# Patient Record
Sex: Female | Born: 1960 | Race: White | Hispanic: No | Marital: Married
Health system: Midwestern US, Academic
[De-identification: ages and names within clinical notes are randomized; demographics above are authoritative.]

---
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
## 2015-10-29 IMAGING — CT Abdomen^1_ABDOMEN_PELVIS_WITH (Adult)
1 series · 15 of 32 positions shown, 19 images · IV contrast (APPLIED)
Comparison: none

[Series 2: abd/pelvis with 5.0 soft tissue · axial · 0.73mm/px · z∈[-430,-30]mm · 15 of 89 slices shown, 19 images]
[im 6/89  soft-tissue]
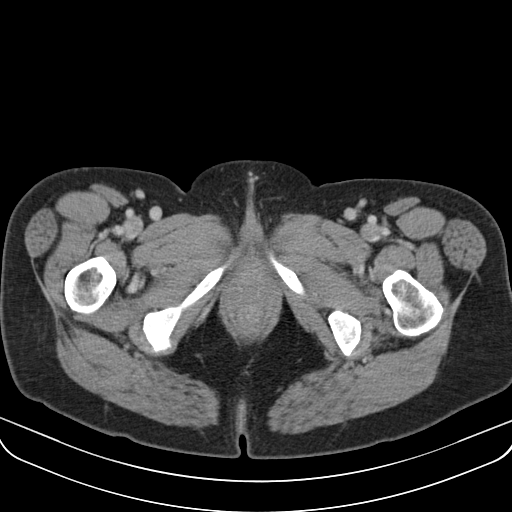
[im 6/89  bone]
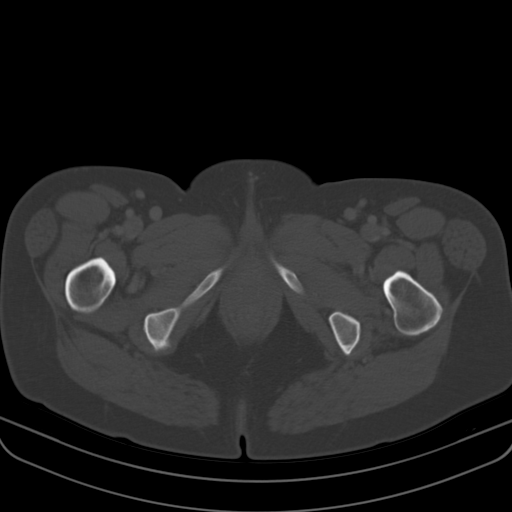
[im 12/89  soft-tissue]
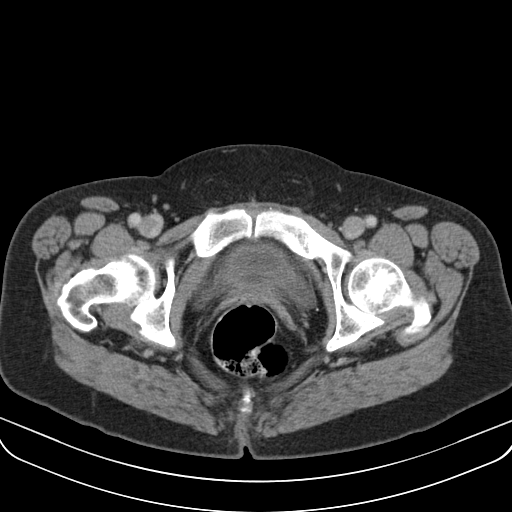
[im 18/89  soft-tissue]
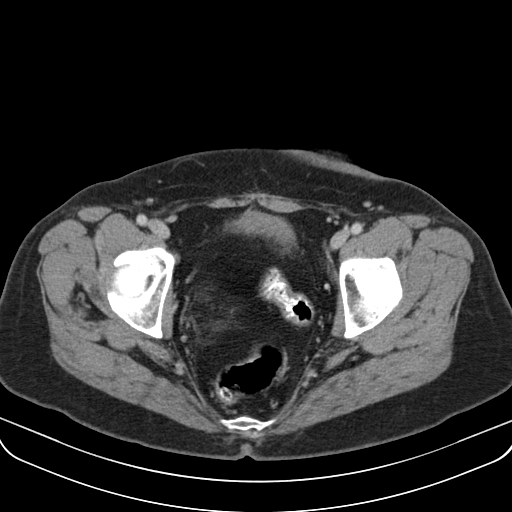
[im 26/89  soft-tissue]
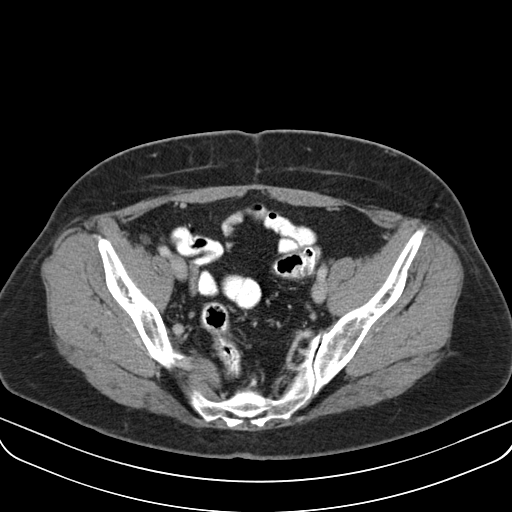
[im 32/89  soft-tissue]
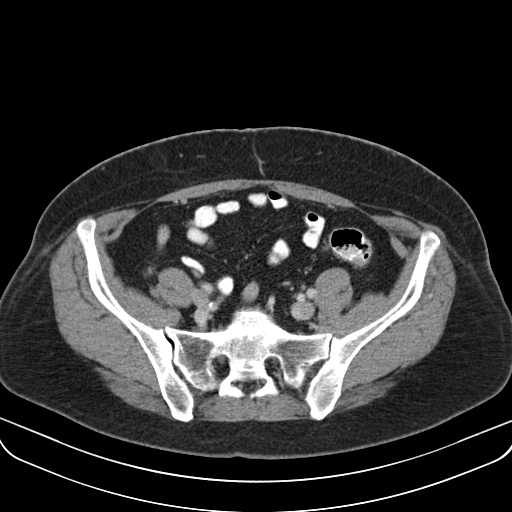
[im 37/89  soft-tissue]
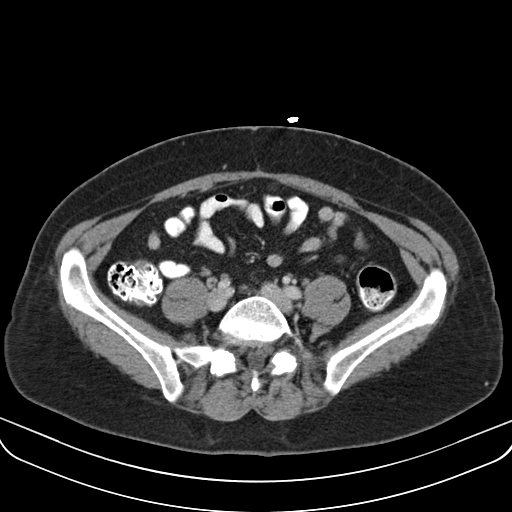
[im 46/89  soft-tissue]
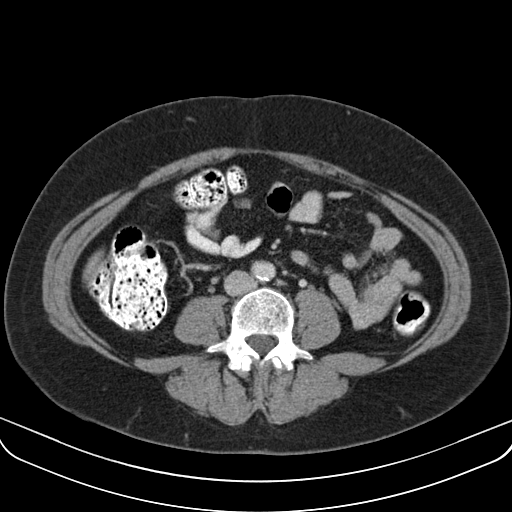
[im 52/89  soft-tissue]
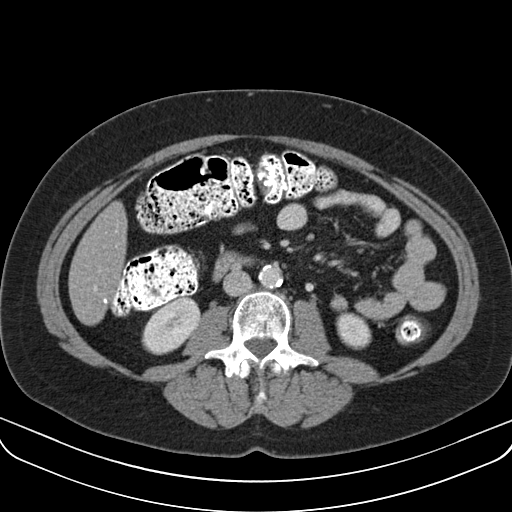
[im 57/89  soft-tissue]
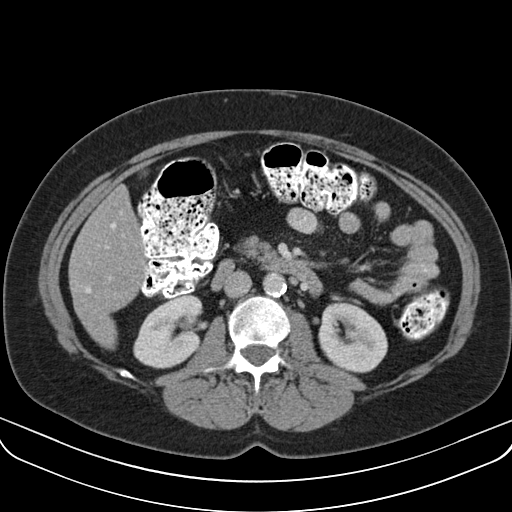
[im 57/89  bone]
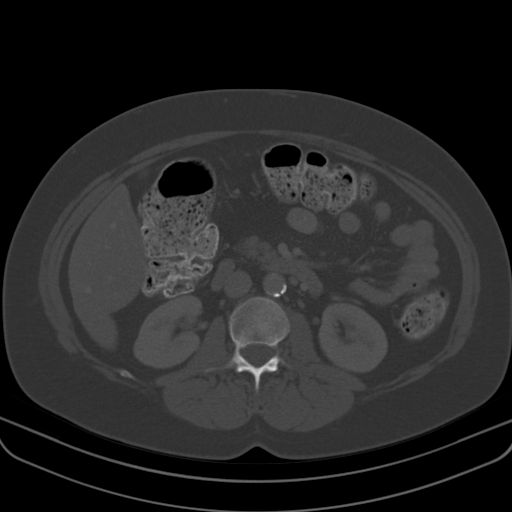
[im 63/89  soft-tissue]
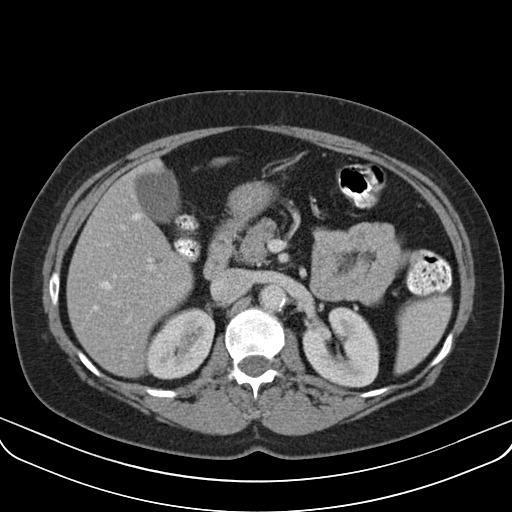
[im 71/89  soft-tissue]
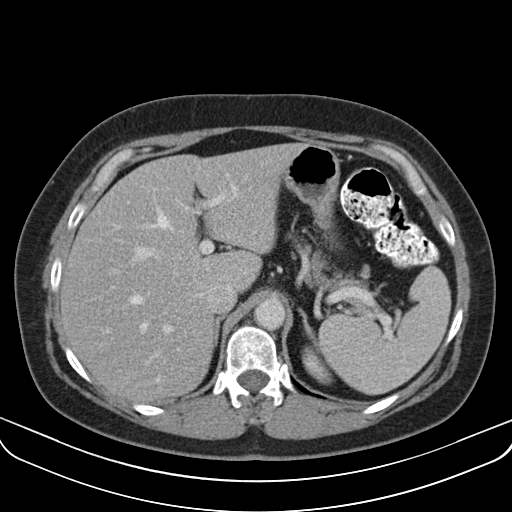
[im 77/89  soft-tissue]
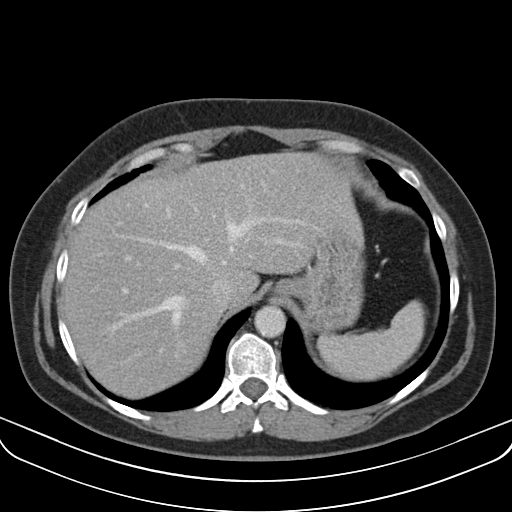
[im 77/89  lung]
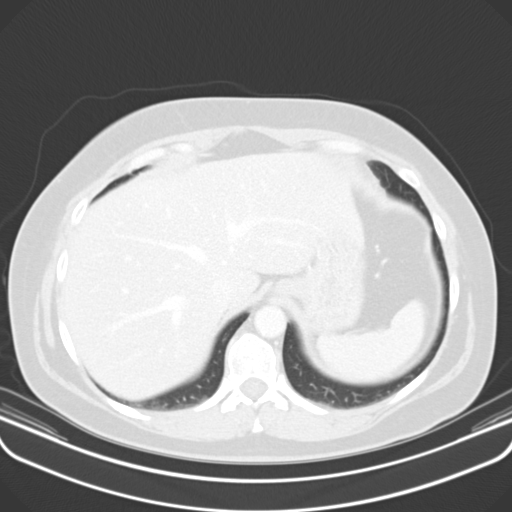
[im 80/89  lung]
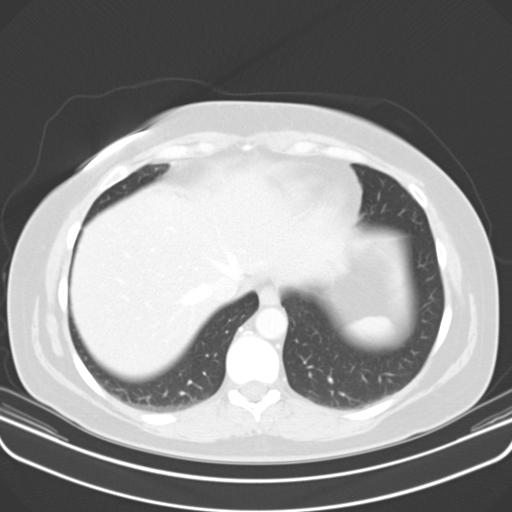
[im 83/89  soft-tissue]
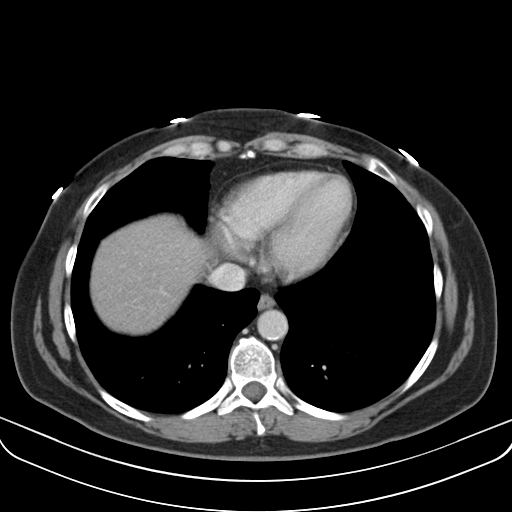
[im 83/89  lung]
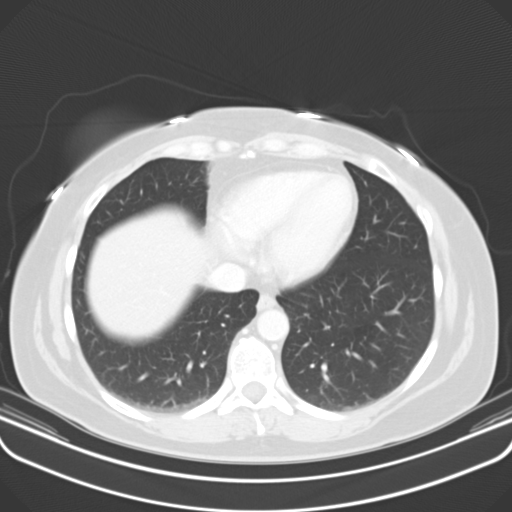
[im 86/89  lung]
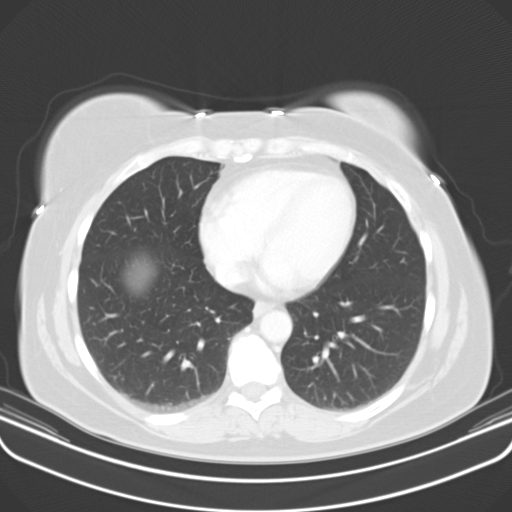

[15 of 32 positions shown; findings below may reference images not displayed]

DIAGNOSTIC STUDIES
CT ABDOMEN AND PELVIS WITH CONTRAST

EXAM
CT ABDOMEN AND PELVIS WITH CONTRAST

INDICATION
abd pain, diarrhea
RIGHT LOW QUAD PAIN. CRE 1.0 GFR 64. ORAL 50CC. 100 IV.

TECHNIQUE
Helical CT of the abdomen and pelvis is performed after the dynamic administration of 044cc's of
Omnipaque 577and the ingestion of oral contrast. Sagittal and coronal reconstruction images are
obtained.
All CT scans at this facility use dose modulation, iterative reconstruction, and/or weight based
dosing when appropriate to reduce radiation dose to as low as reasonably achievable.

COMPARISONS
None

FINDINGS
Minimal dependent atelectasis is present. The E liver, gallbladder, biliary system, spleen,
adrenal glands, pancreas and kidneys have a normal CT appearance. Mild arterial atherosclerotic
calcification is present. The vasculature is otherwise unremarkable.
Shotty retroperitoneal, iliac, and inguinal nodes are present but not pathologic by size
criterion. No pathologic adenopathy is identified. The colon is redundant. Rare diverticula is
present. The visualized bowel is otherwise unremarkable. The appendix is not definitely visualized
; however, no thick-walled or dilated loops of bowel is present in the right lower quadrant.
The bladder has a normal CT appearance. The uterus is not visualized raise the possibility of
surgical absence. Pelvic structures are otherwise unremarkable. Soft tissue density posterior to
the right external iliac vessels on image is 62 through 68 is presumably related to the right adnexa
/ovary. No free pelvic fluid is present.
Mild degenerative changes are present. Minimal scoliosis which may be in part positional is
present.

IMPRESSION
Normal CT of the abdomen and pelvis.

## 2016-03-11 IMAGING — CR ABDOMEN
2 series · 2 of 2 positions shown · non-contrast
Comparison: none

[abdomen upright]
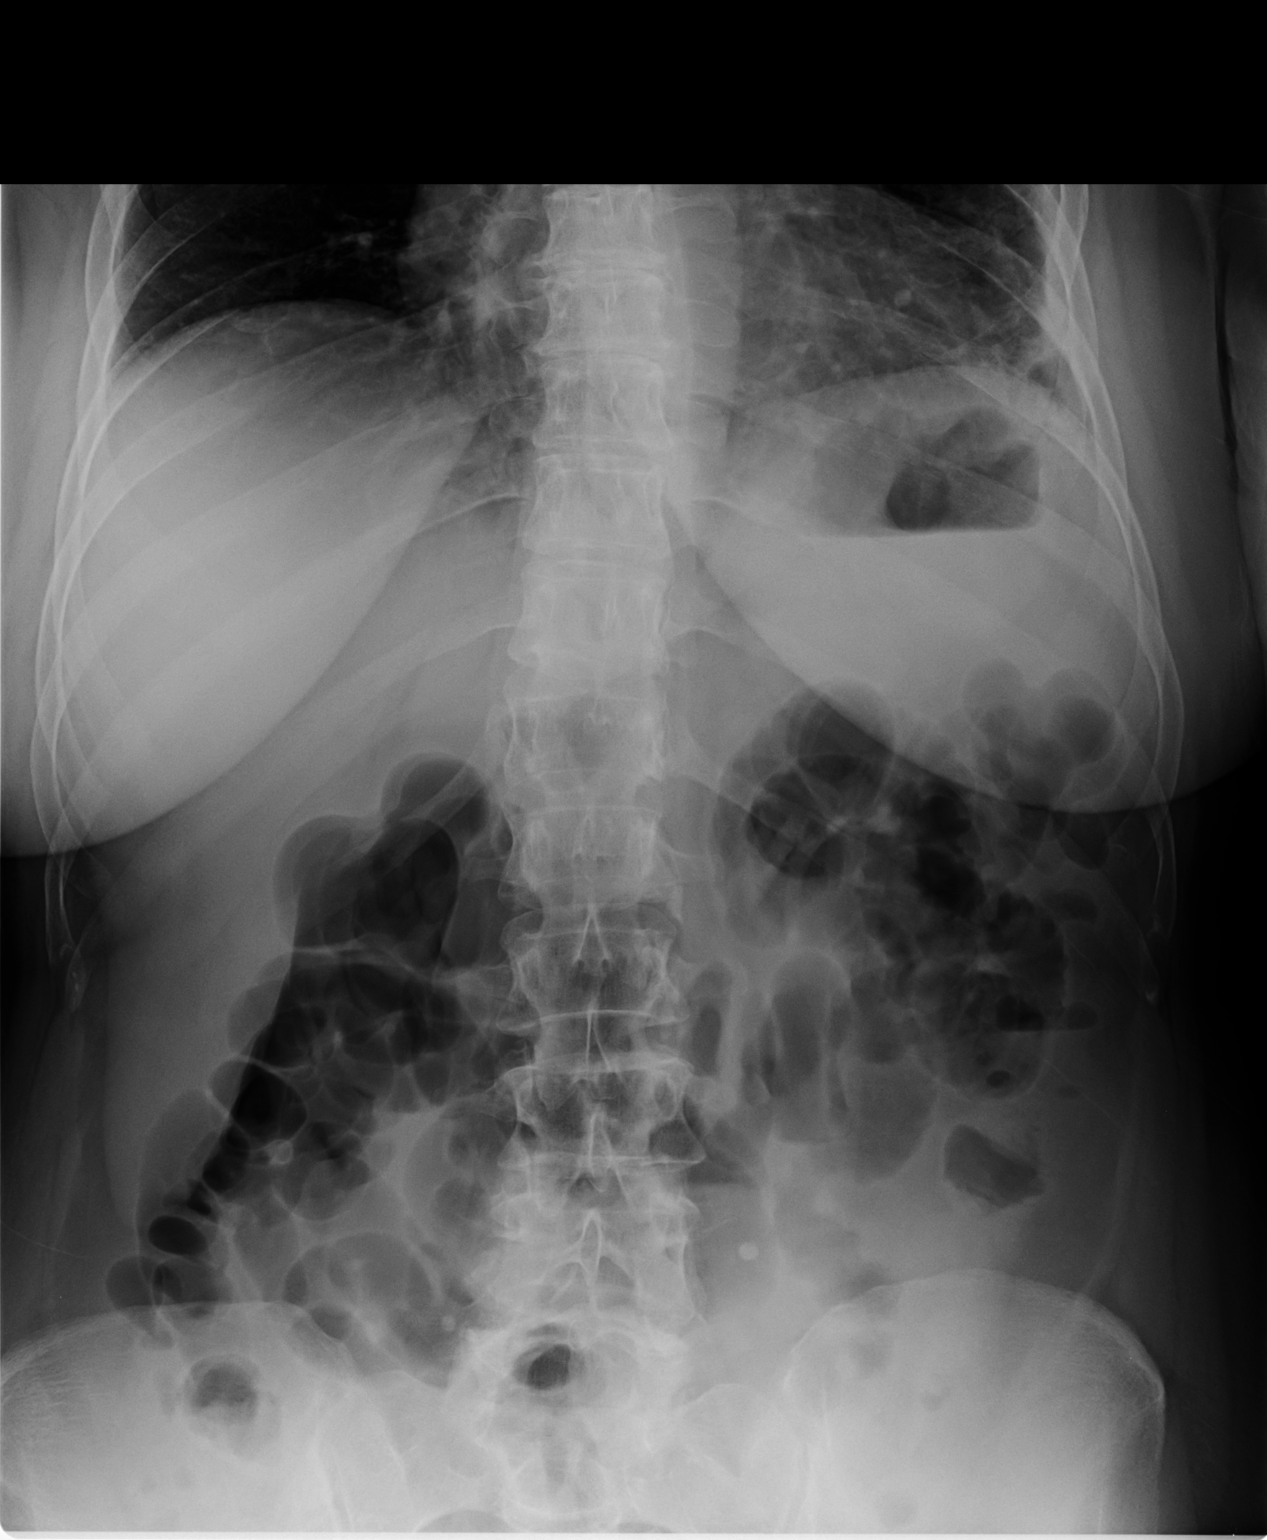

[abdomen supine kub]
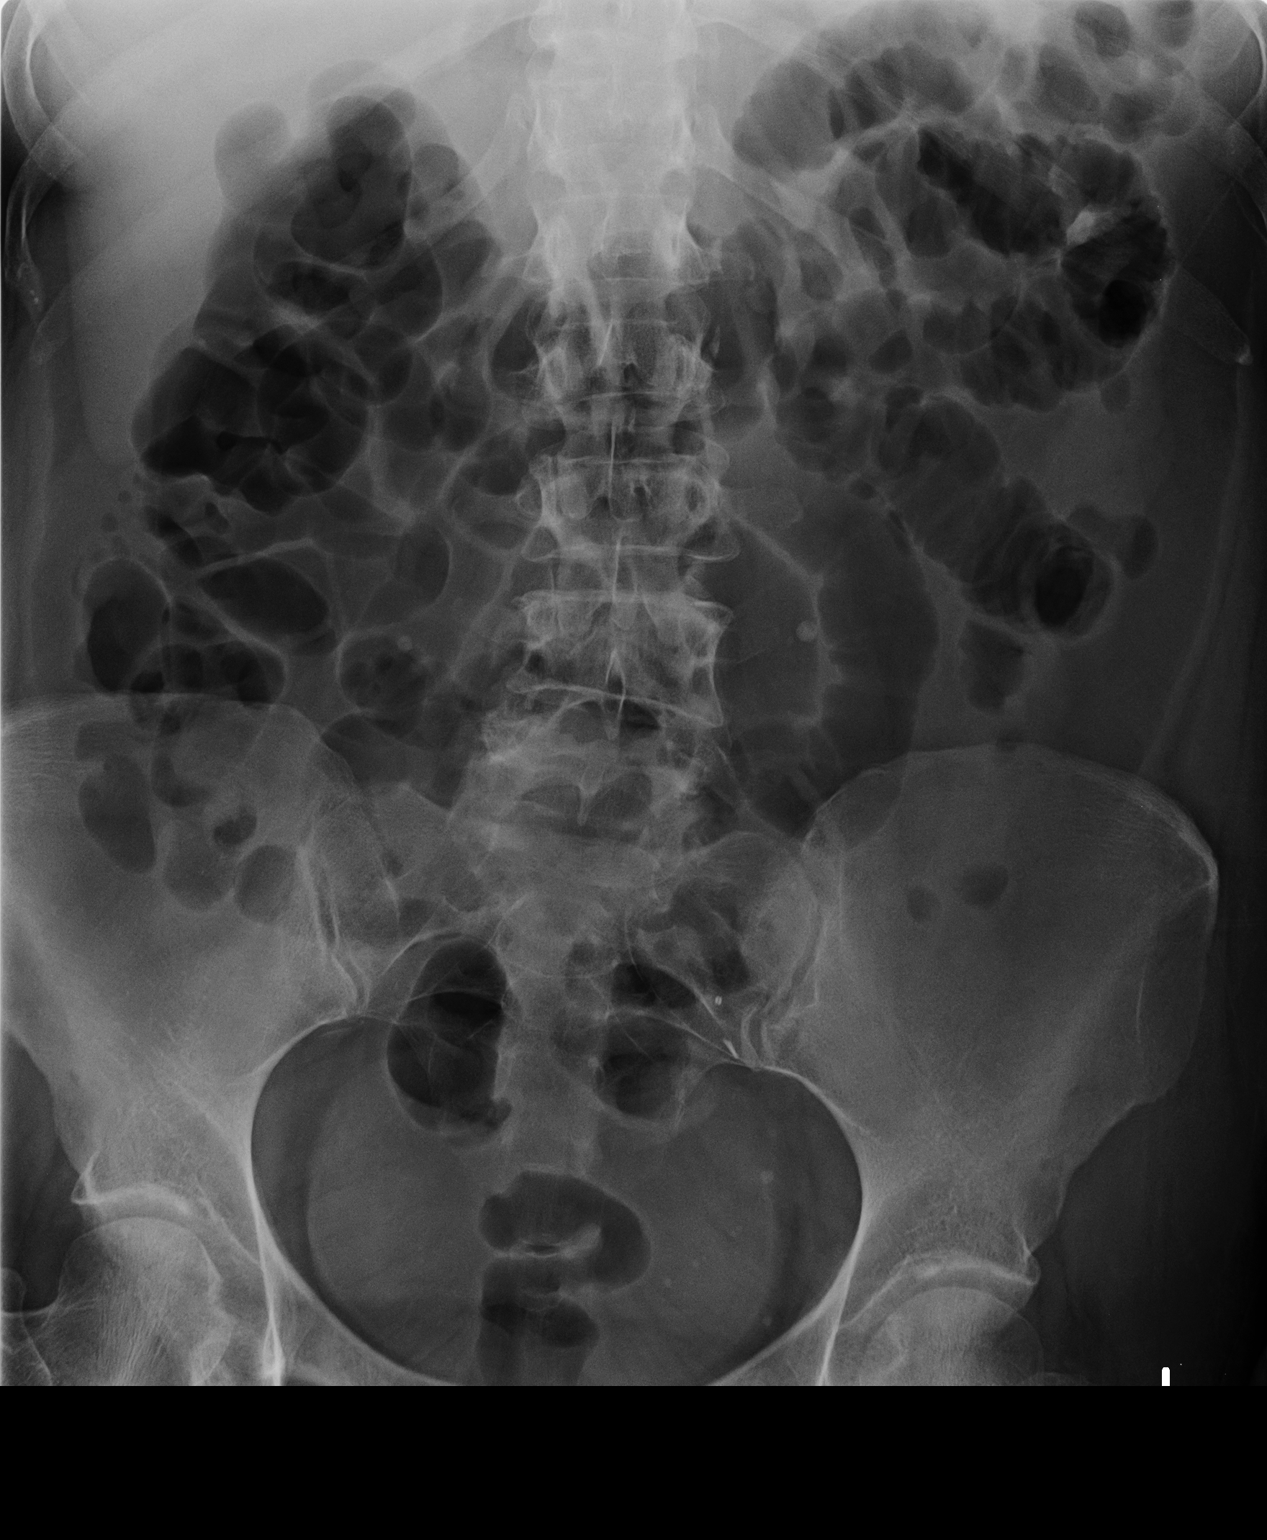

[2 of 2 positions shown; findings below may reference images not displayed]

DIAGNOSTIC STUDIES

EXAM
TWO-VIEW ABDOMEN

INDICATION
Colonic polyps and abdominal pain.

TECHNIQUE
Upright and supine frontal radiographs of the abdomen and pelvis were performed.

COMPARISONS
None

FINDINGS
The lung bases are clear. No free intraperitoneal gas is detected. The bowel gas pattern is
nonspecific and nonobstructive. Mild diffuse gaseous distention is likely related to insufflation
from recent colonoscopy. No abnormal calcifications are seen.

IMPRESSION
Nonspecific bowel gas pattern and no evidence of free intraperitoneal gas.

## 2016-05-17 IMAGING — CR CHEST
3 series · 3 of 3 positions shown · non-contrast
Comparison: none

[shoulder y-view]
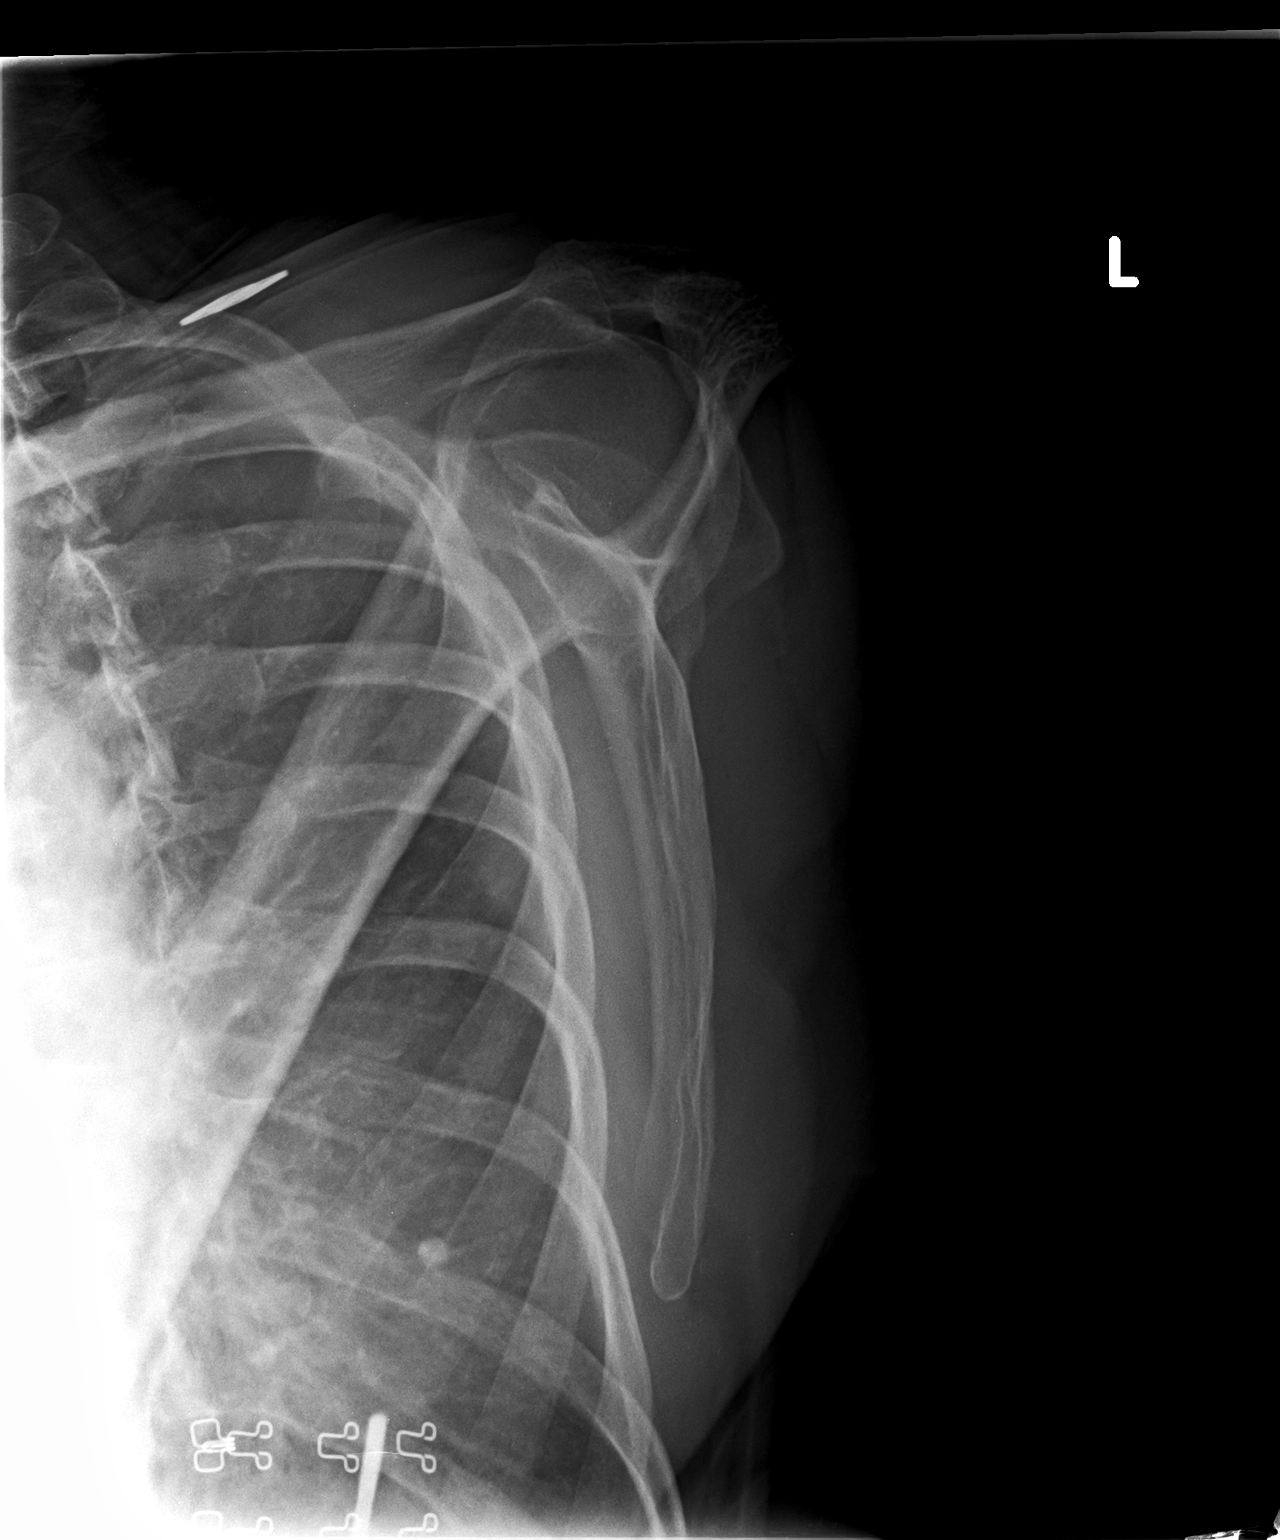

[shoulder external]
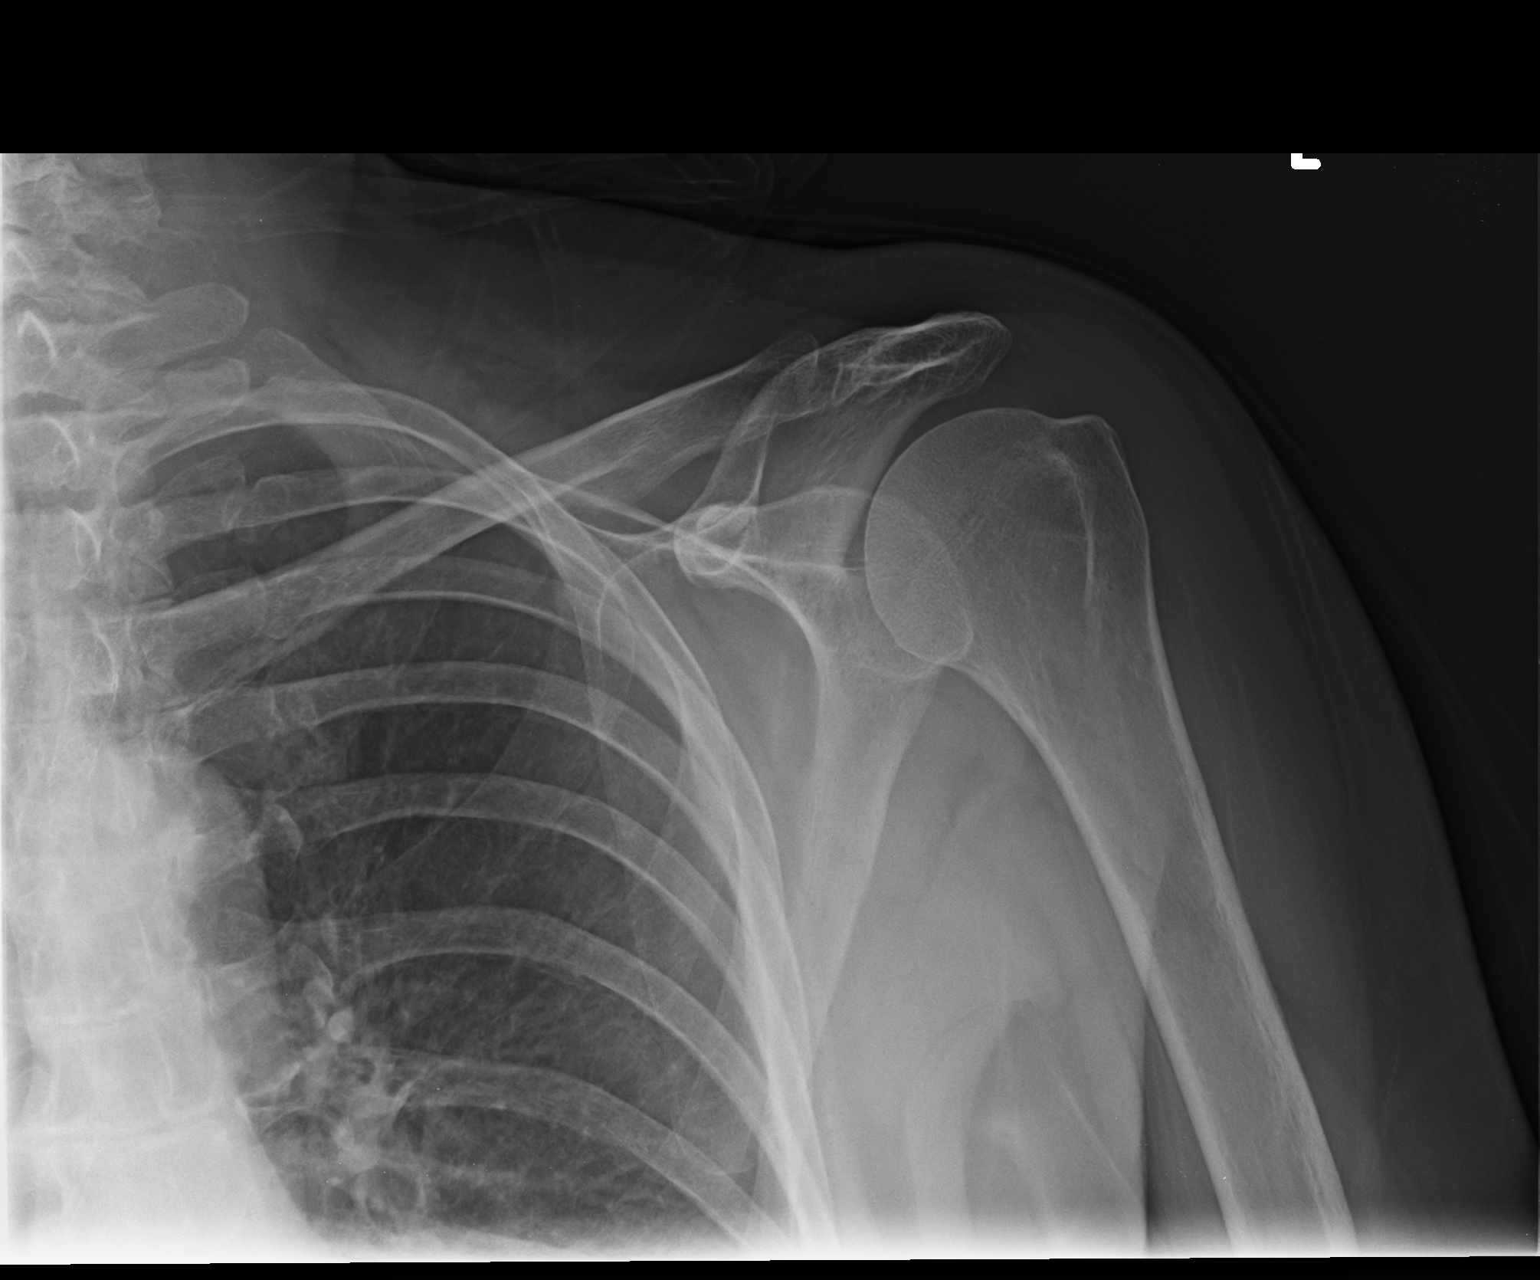

[shoulder internal]
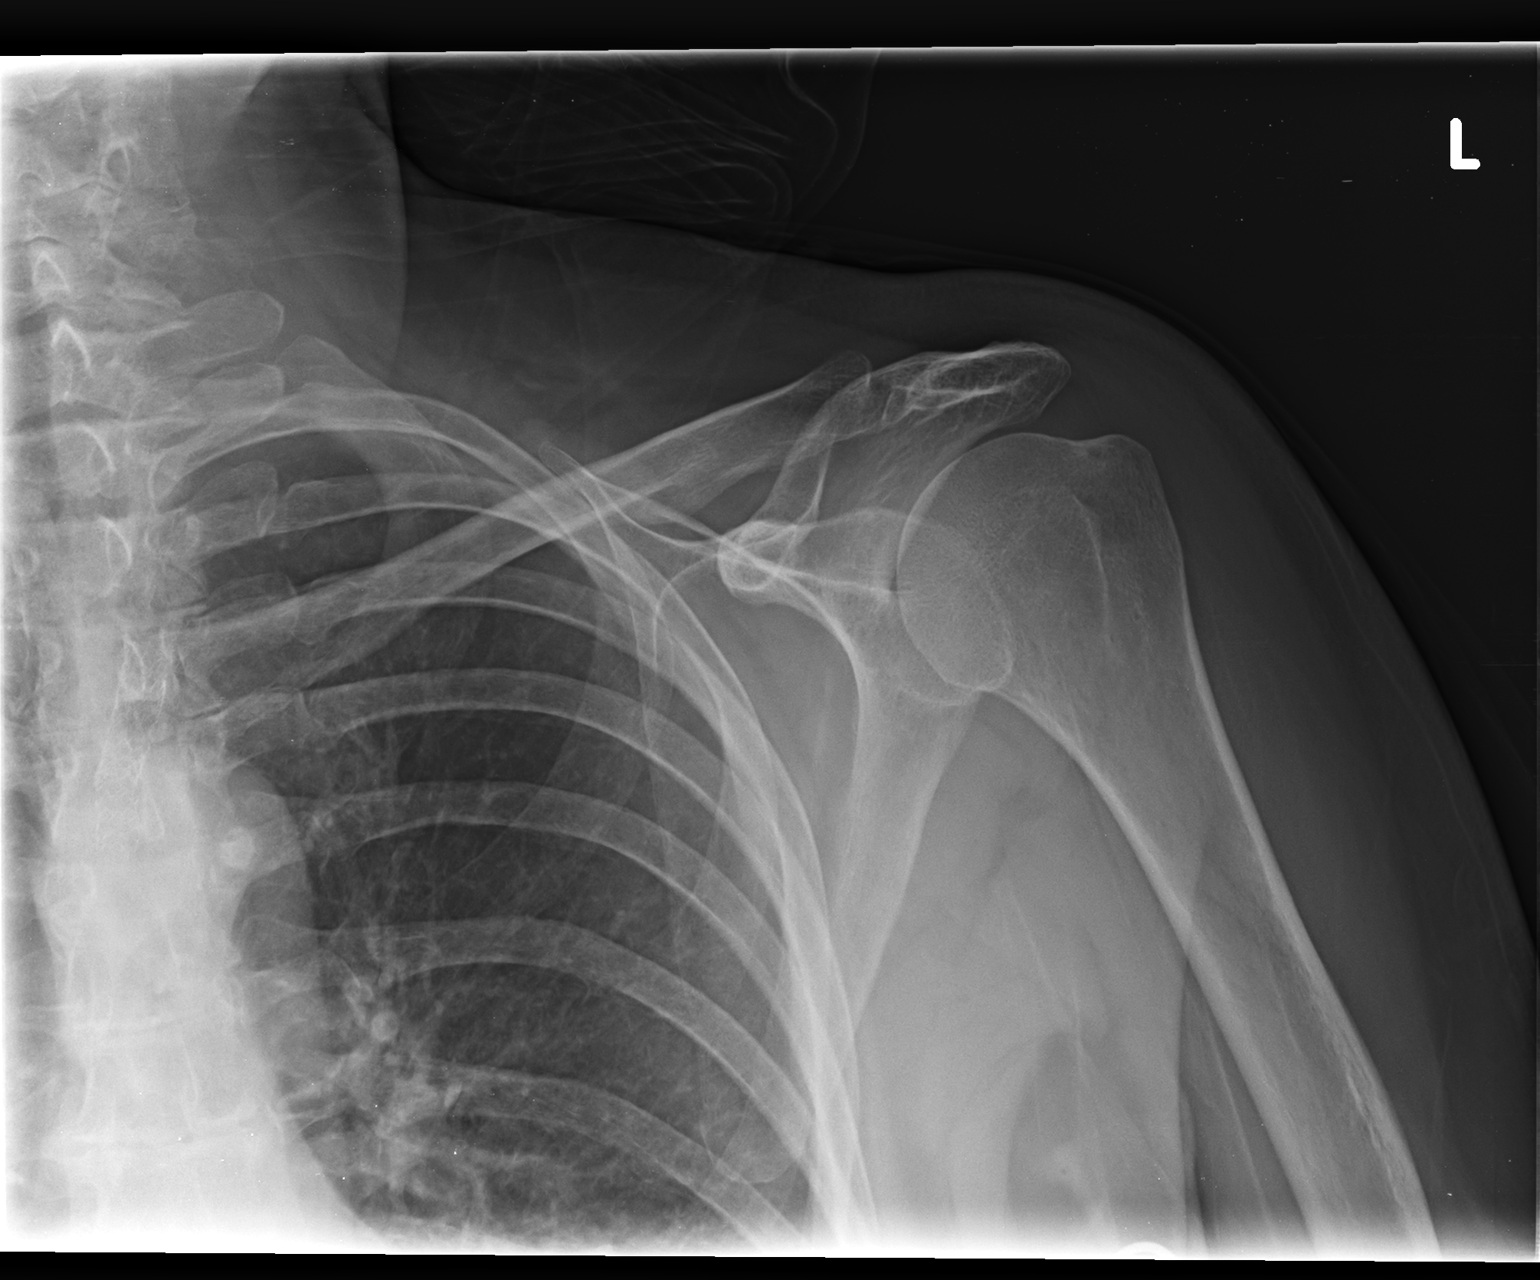

[3 of 3 positions shown; findings below may reference images not displayed]

EXAM

Left shoulder.

INDICATION

Left shoulder pain x 2 days. LImited ROM.
LT SHOULDER PAIN X2 DAYS. LIMITED ROM.

FINDINGS

Three views of the left shoulder were obtained.

There are small osteophytes of the distal clavicle and acromion process. There is no fracture or
acute appearing abnormality.

IMPRESSION

There is mild osteoarthrosis of the left AC joint. There is no acute appearing radiographic
abnormality of the left shoulder.

## 2016-12-28 IMAGING — CR CHEST
1 series · 1 of 1 positions shown · non-contrast
Comparison: none

[chest port x-wise]
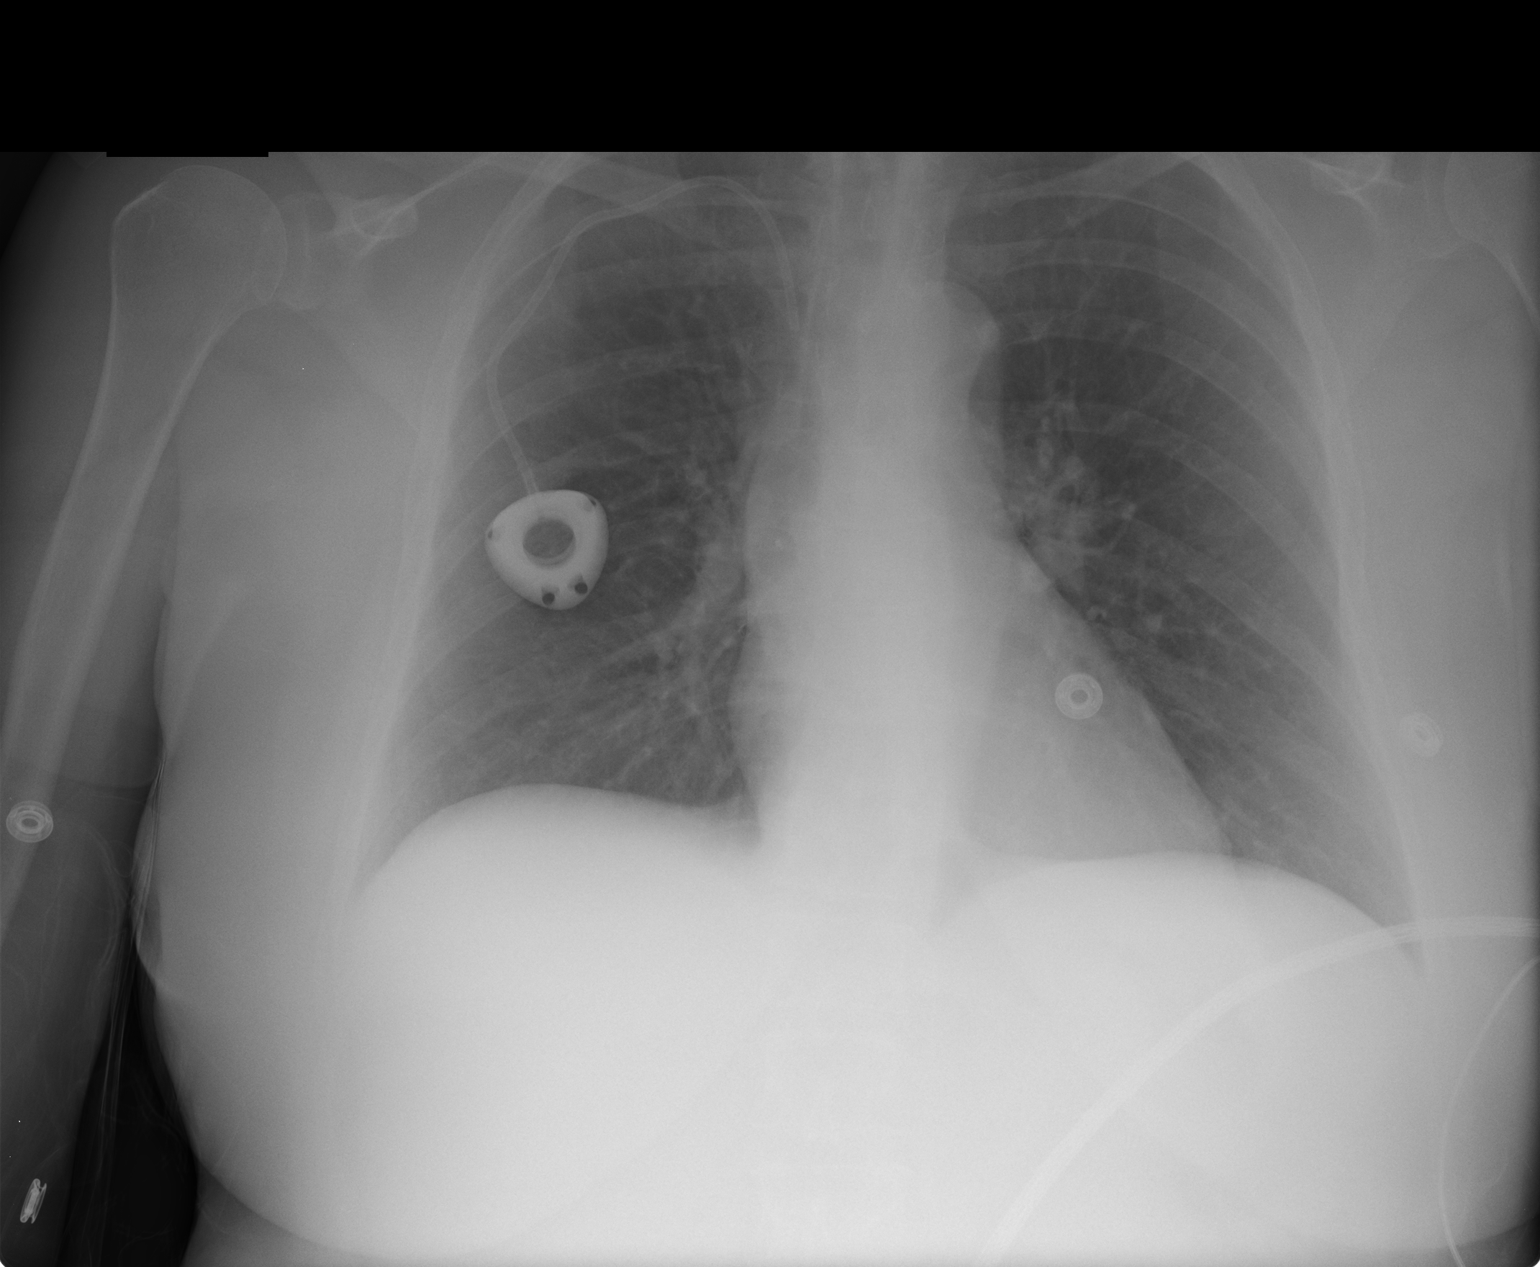

[1 of 1 positions shown; findings below may reference images not displayed]

DIAGNOSTIC STUDIES

EXAM

Chest.

INDICATION

post-op port placement
PORT PLACEMENT. ME

TECHNIQUE

AP chest.

COMPARISONS

Fluoroscopic guidance exam from same date

FINDINGS

There is a right subclavian porta catheter with the tip overlying the upper superior vena cava. No
pneumothorax. No consolidation or pleural effusion. The cardiomediastinal silhouette is normal.

IMPRESSION

Right subclavian porta catheter tip extends to the upper superior vena cava.

## 2017-02-17 IMAGING — US LEVEINL
1 series · 14 of 16 positions shown · non-contrast
Comparison: none

[Series 1: duplex low extrem veins left · 14 of 39 slices shown]
[im 1/39]
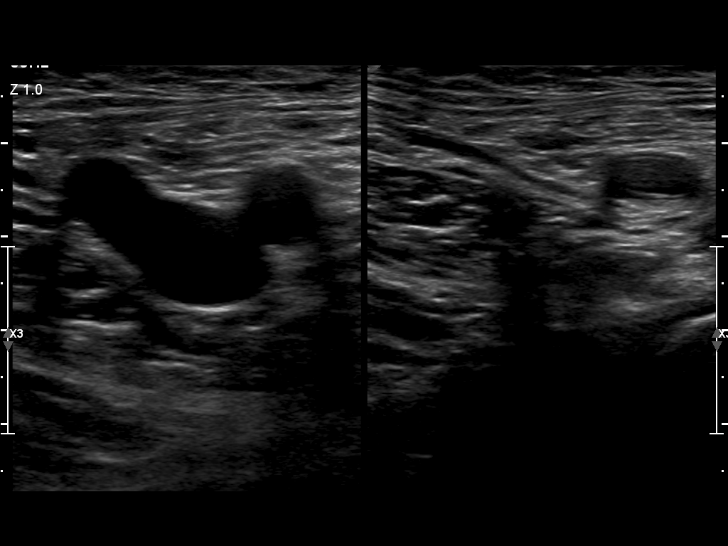
[im 3/39]
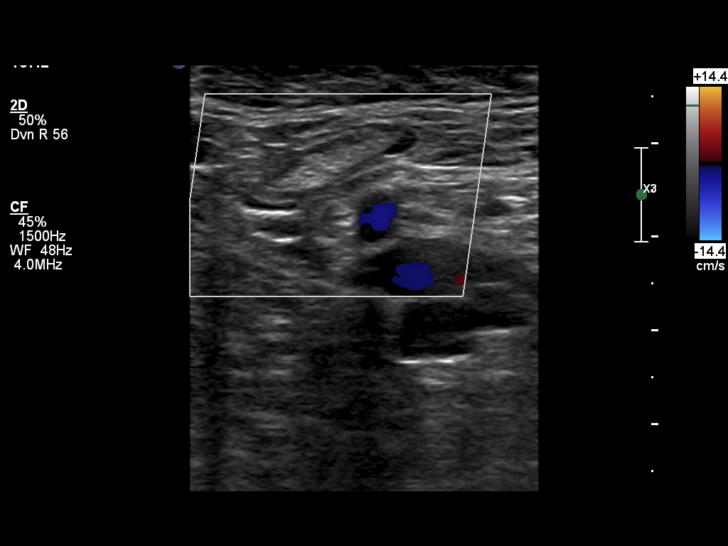
[im 6/39]
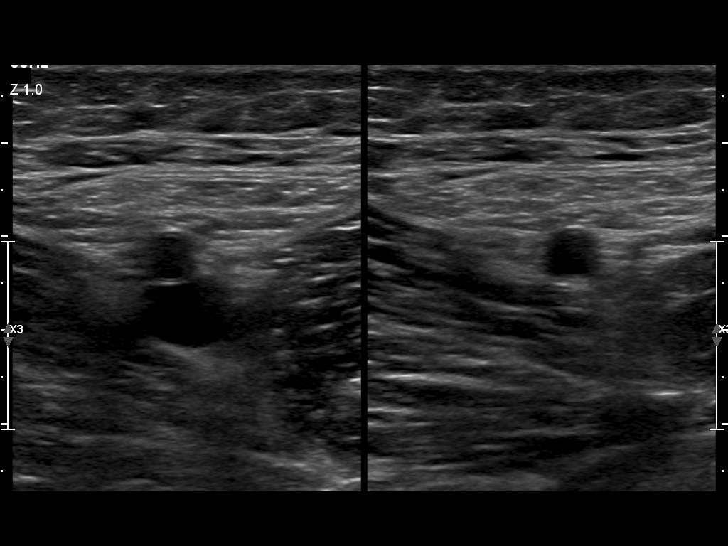
[im 11/39]
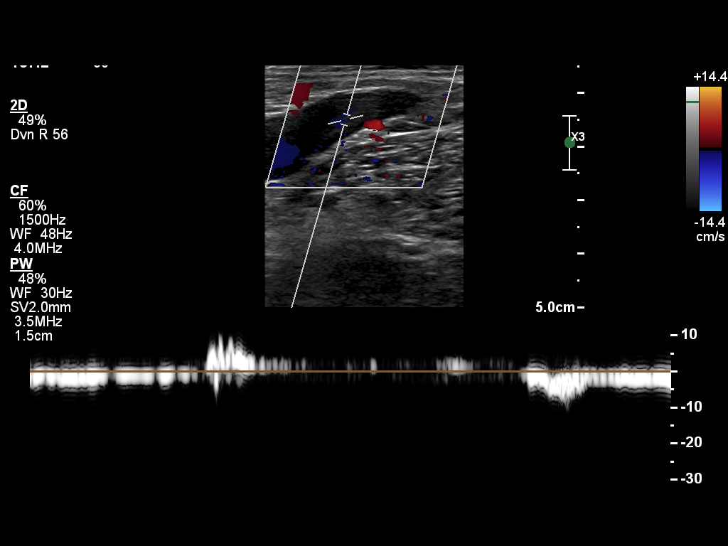
[im 13/39]
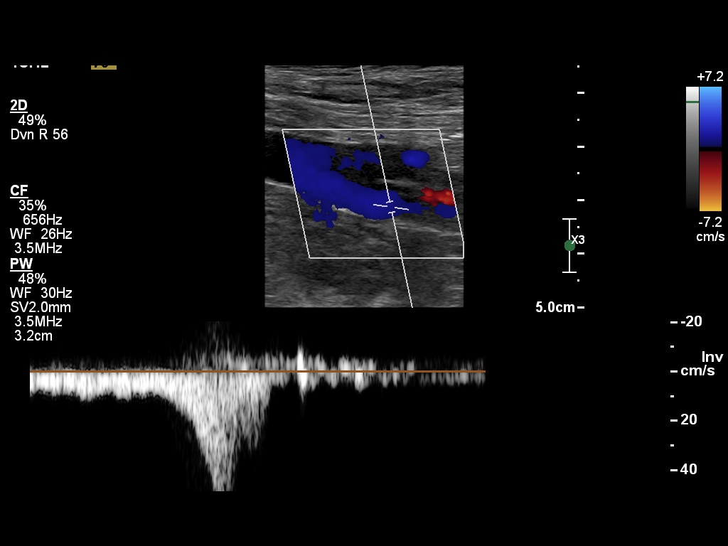
[im 16/39]
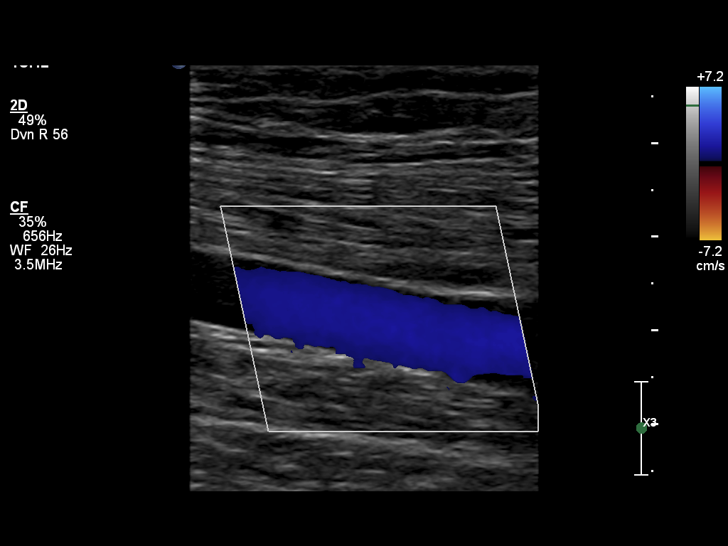
[im 18/39]
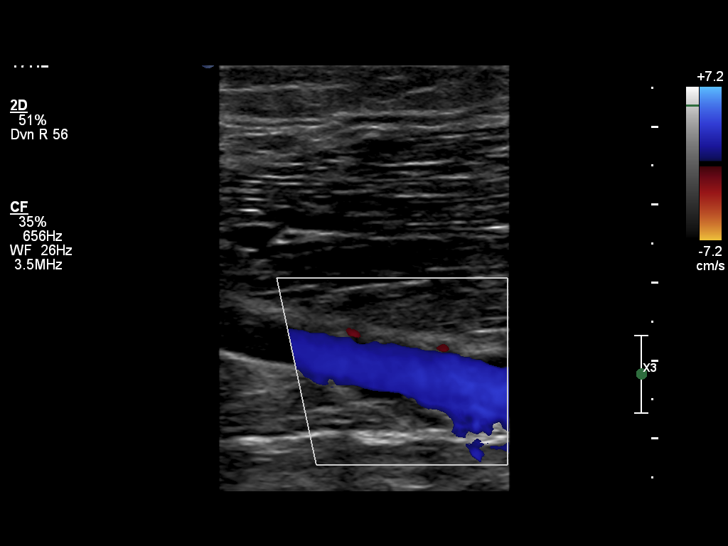
[im 21/39]
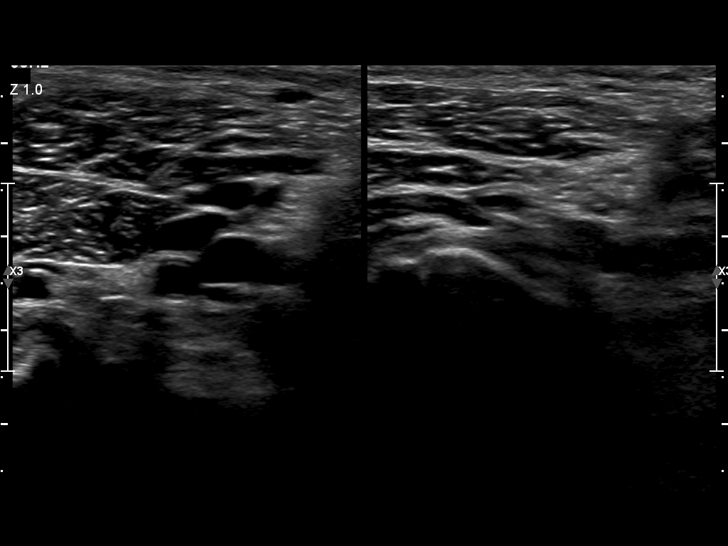
[im 23/39]
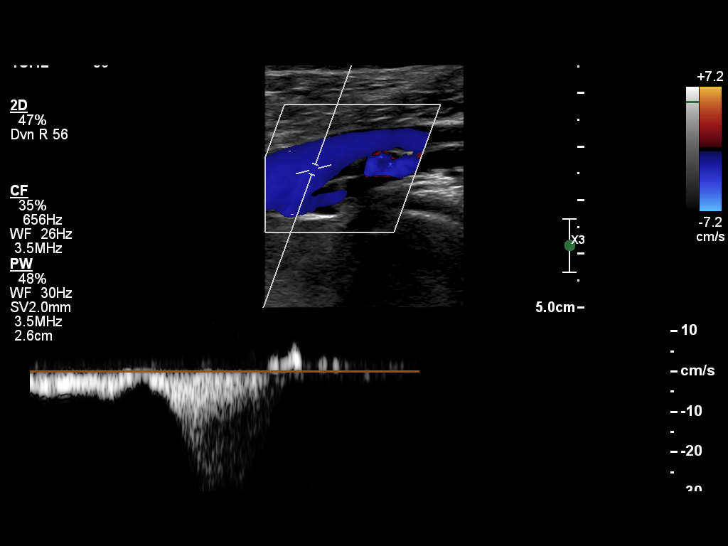
[im 26/39]
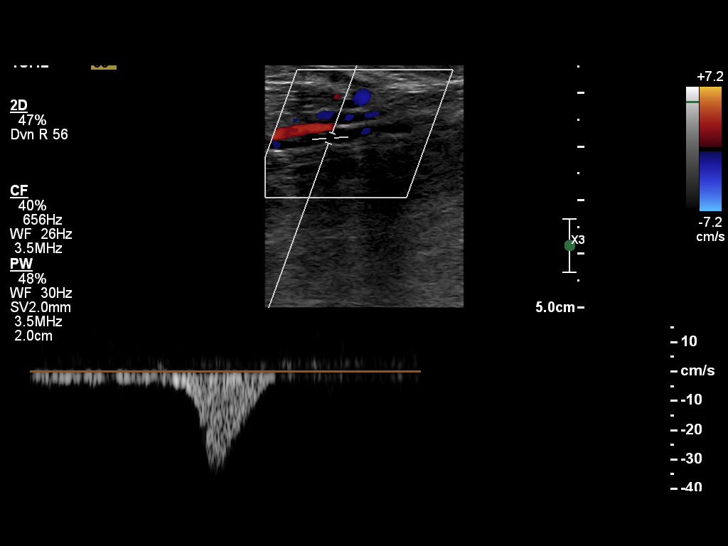
[im 31/39]
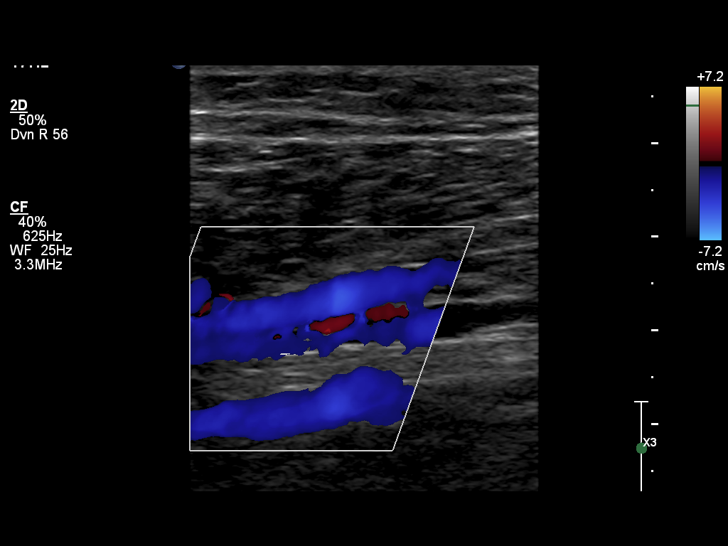
[im 33/39]
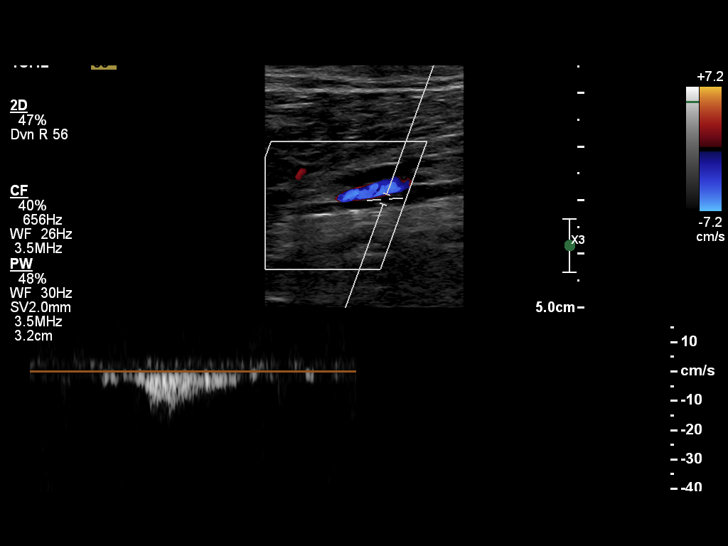
[im 36/39]
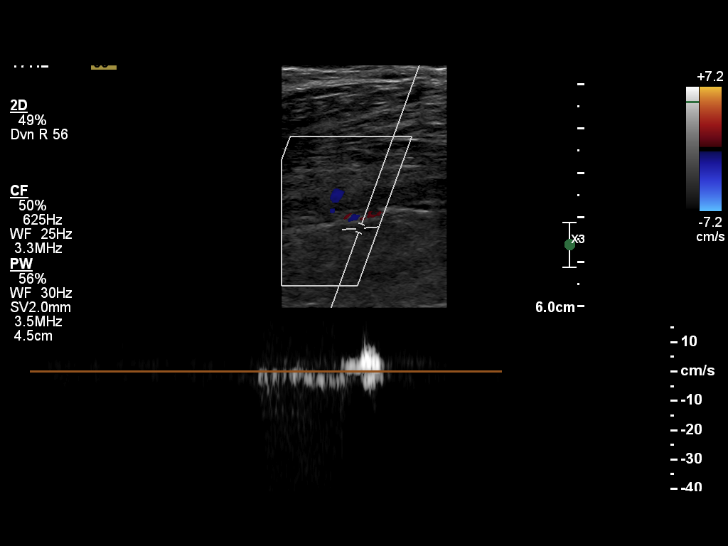
[im 39/39]
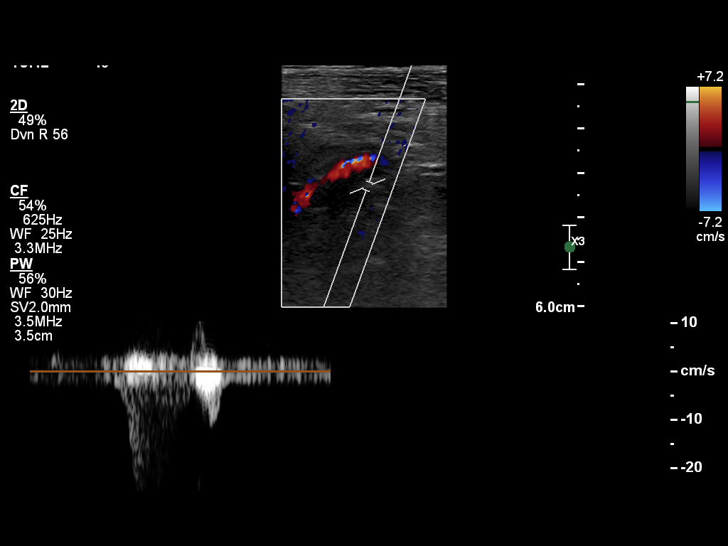

[14 of 16 positions shown; findings below may reference images not displayed]

ULTRASOUND REPORT

EXAM
DUPLEX SCAN OF EXTREMITY VEINS INCLUDING RESPONSES TO COMPRESSION AND OTHER MANEUVERS, UNILATERAL
OR LIMITED STUDY CPT 32315

INDICATION
LEFT CALF PAIN AND SWELLING

TECHNIQUE
Multiple static grayscale and color Doppler ultrasound images are provided from a left lower
extremity venous Doppler ultrasound.

COMPARISONS
None available at the time of dictation.

FINDINGS
The left common femoral vein, superficial femoral vein, popliteal vein, anterior tibial, posterior
tibial, and peroneal veins demonstrate adequate compressibility, respiratory variation and
augmentation of flow.

IMPRESSION
No evidence of DVT on the left lower extremity.

## 2017-07-21 IMAGING — CR CHEST
4 series · 4 of 4 positions shown · non-contrast
Comparison: none

[shoulder internal]
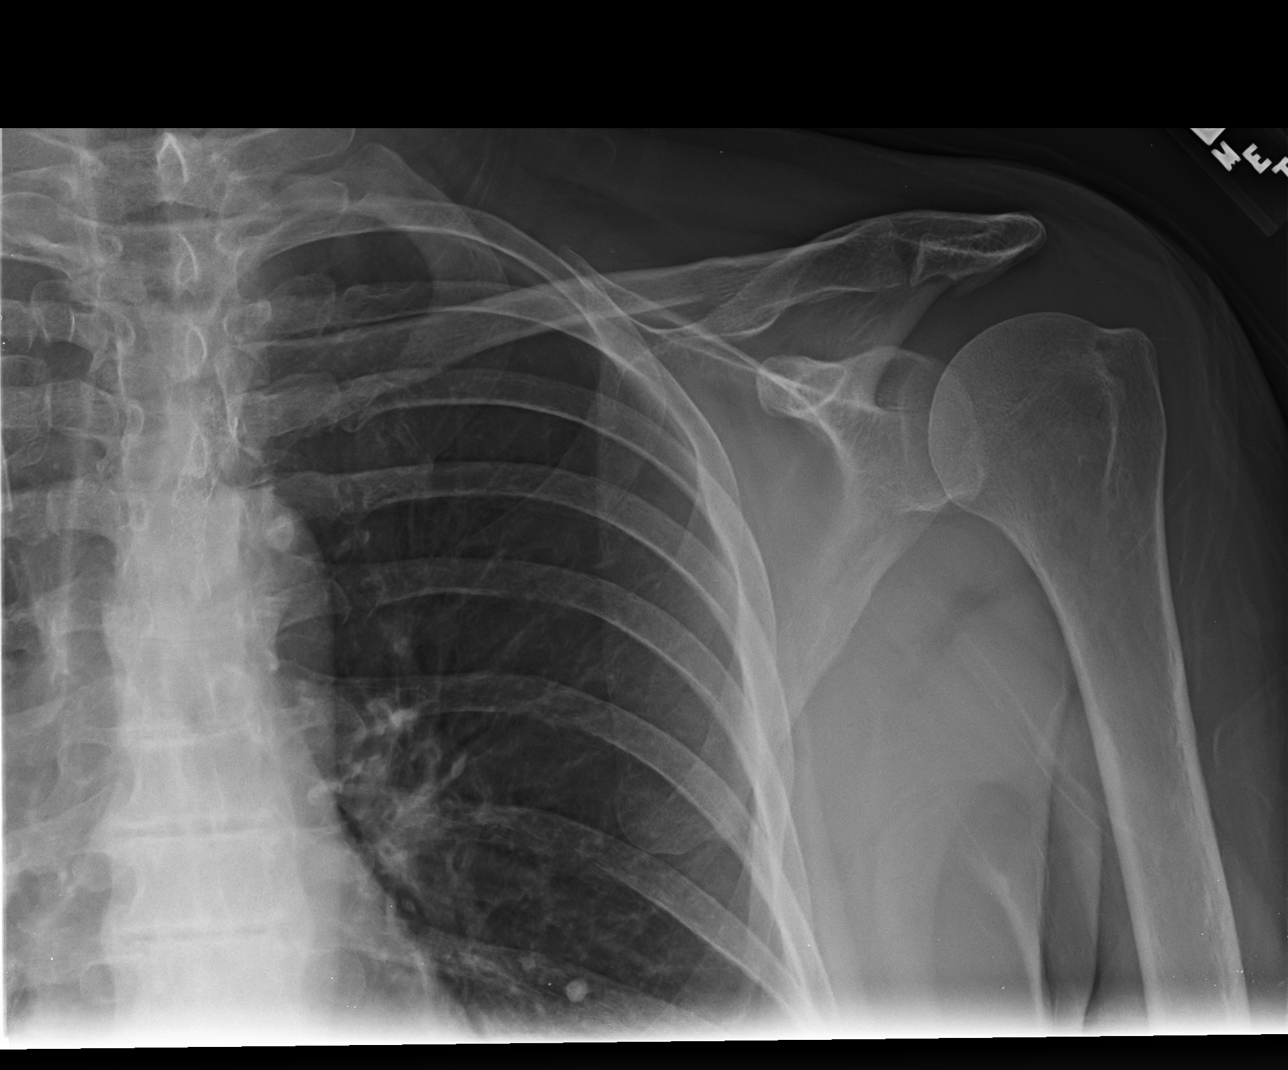

[shoulder external]
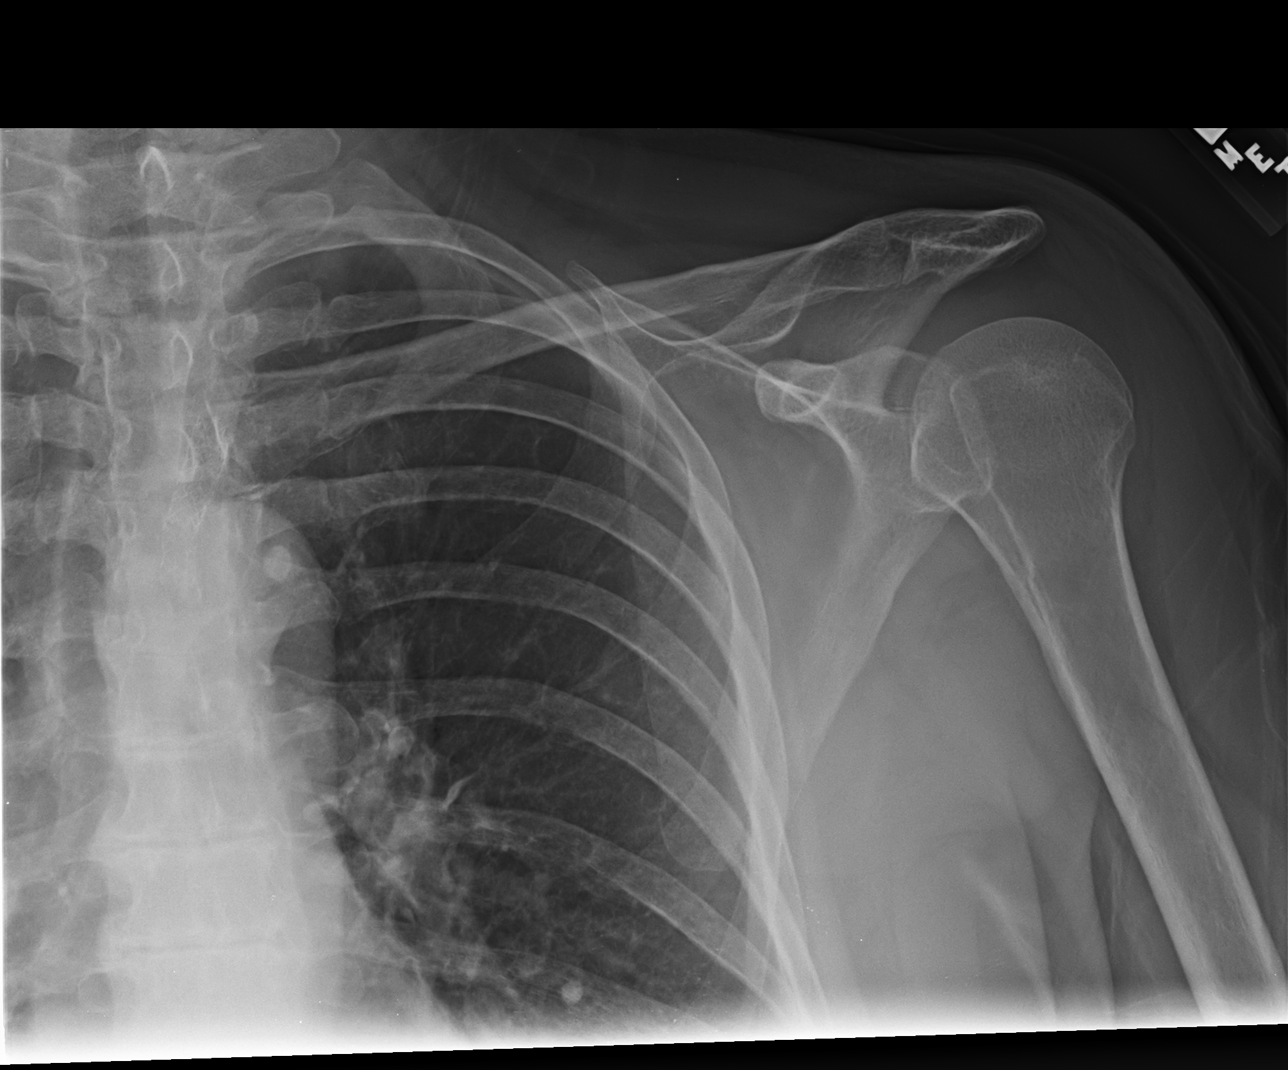

[shoulder y-view]
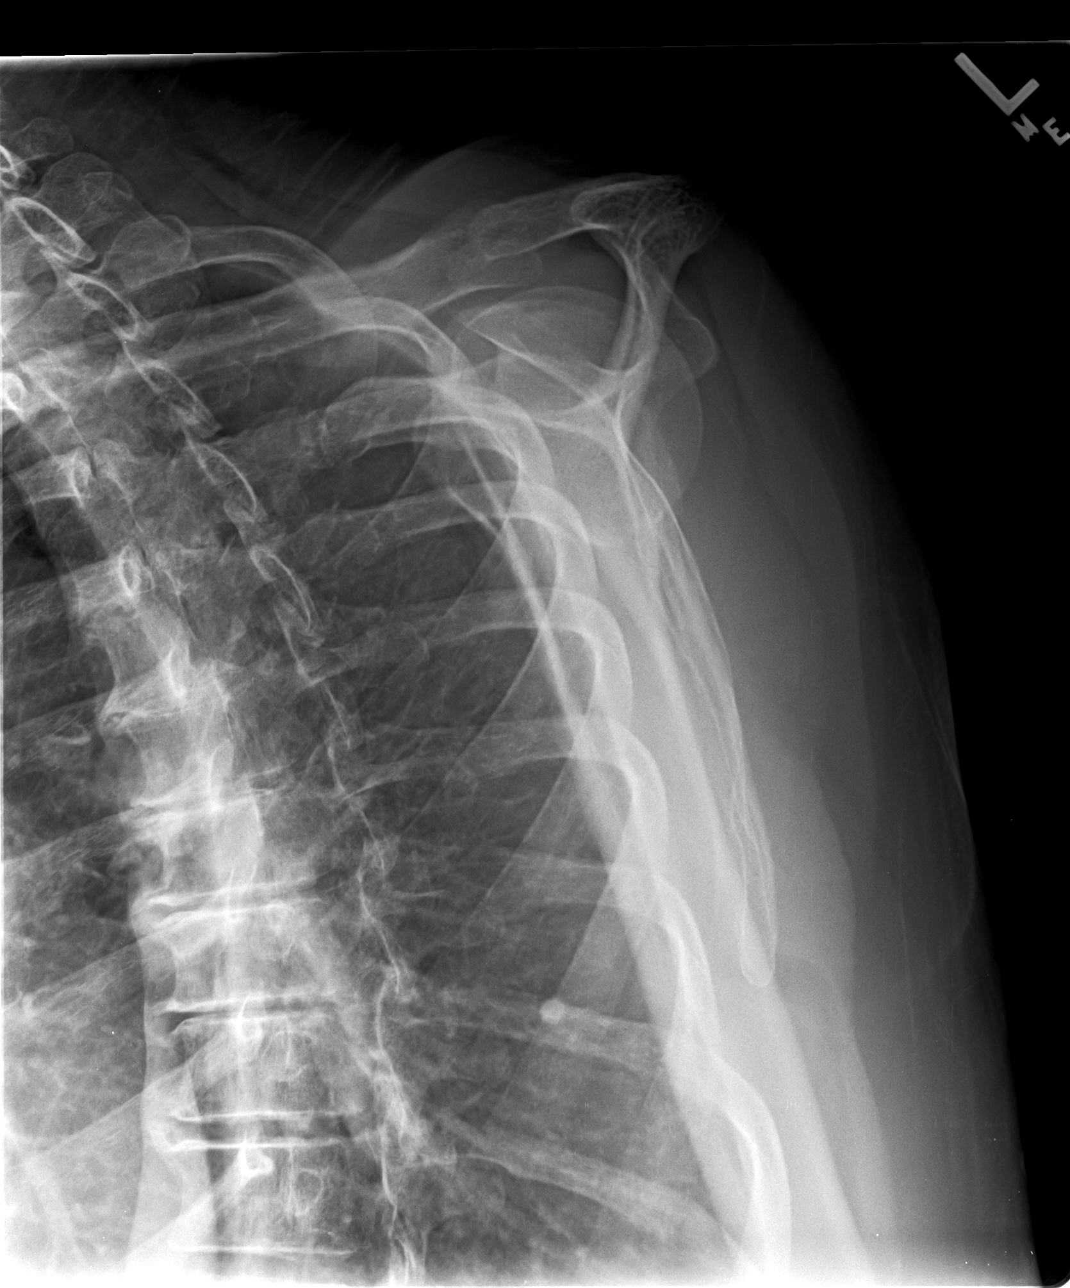

[shoulder axillary]
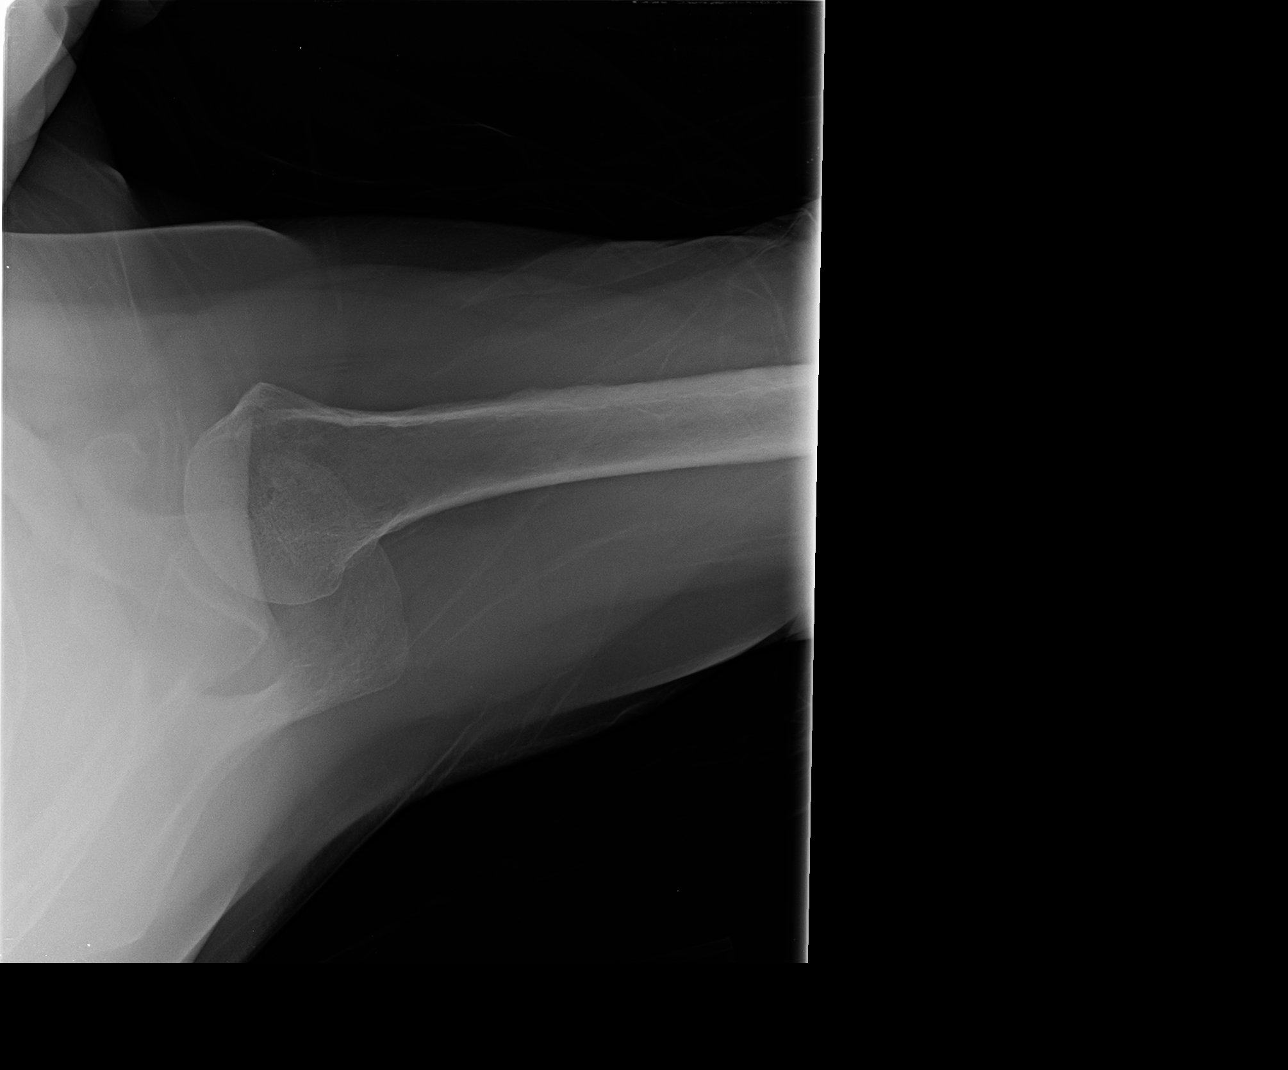

[4 of 4 positions shown; findings below may reference images not displayed]

DIAGNOSTIC STUDIES

EXAM

RADIOLOGICAL EXAMINATION LEFT SHOULDER; COMPLETE, MINIMUM 2 VIEWS CPT 21414

INDICATION

LT SHOULDER PAIN- NO TRAUMA
PATIENT STATES LEFT SHOUDLER PAIN ,NO TRAUMA, PT. STATES HAD TO PULL UP
PANTS.

TECHNIQUE

Internal, external rotation and scapular Y views left shoulder

COMPARISONS

None available.

FINDINGS

There is no displaced fracture, dislocation or other acute osseous abnormality. The soft tissues
are unremarkable. The partially visualized lung is clear. Calcified granuloma of the left upper
lobe is appreciated.

IMPRESSION

No acute osseous abnormality is identified.
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
## 2018-12-17 IMAGING — CR LOW_EXM
2 series · 2 of 2 positions shown · non-contrast
Comparison: none

[knee ap]
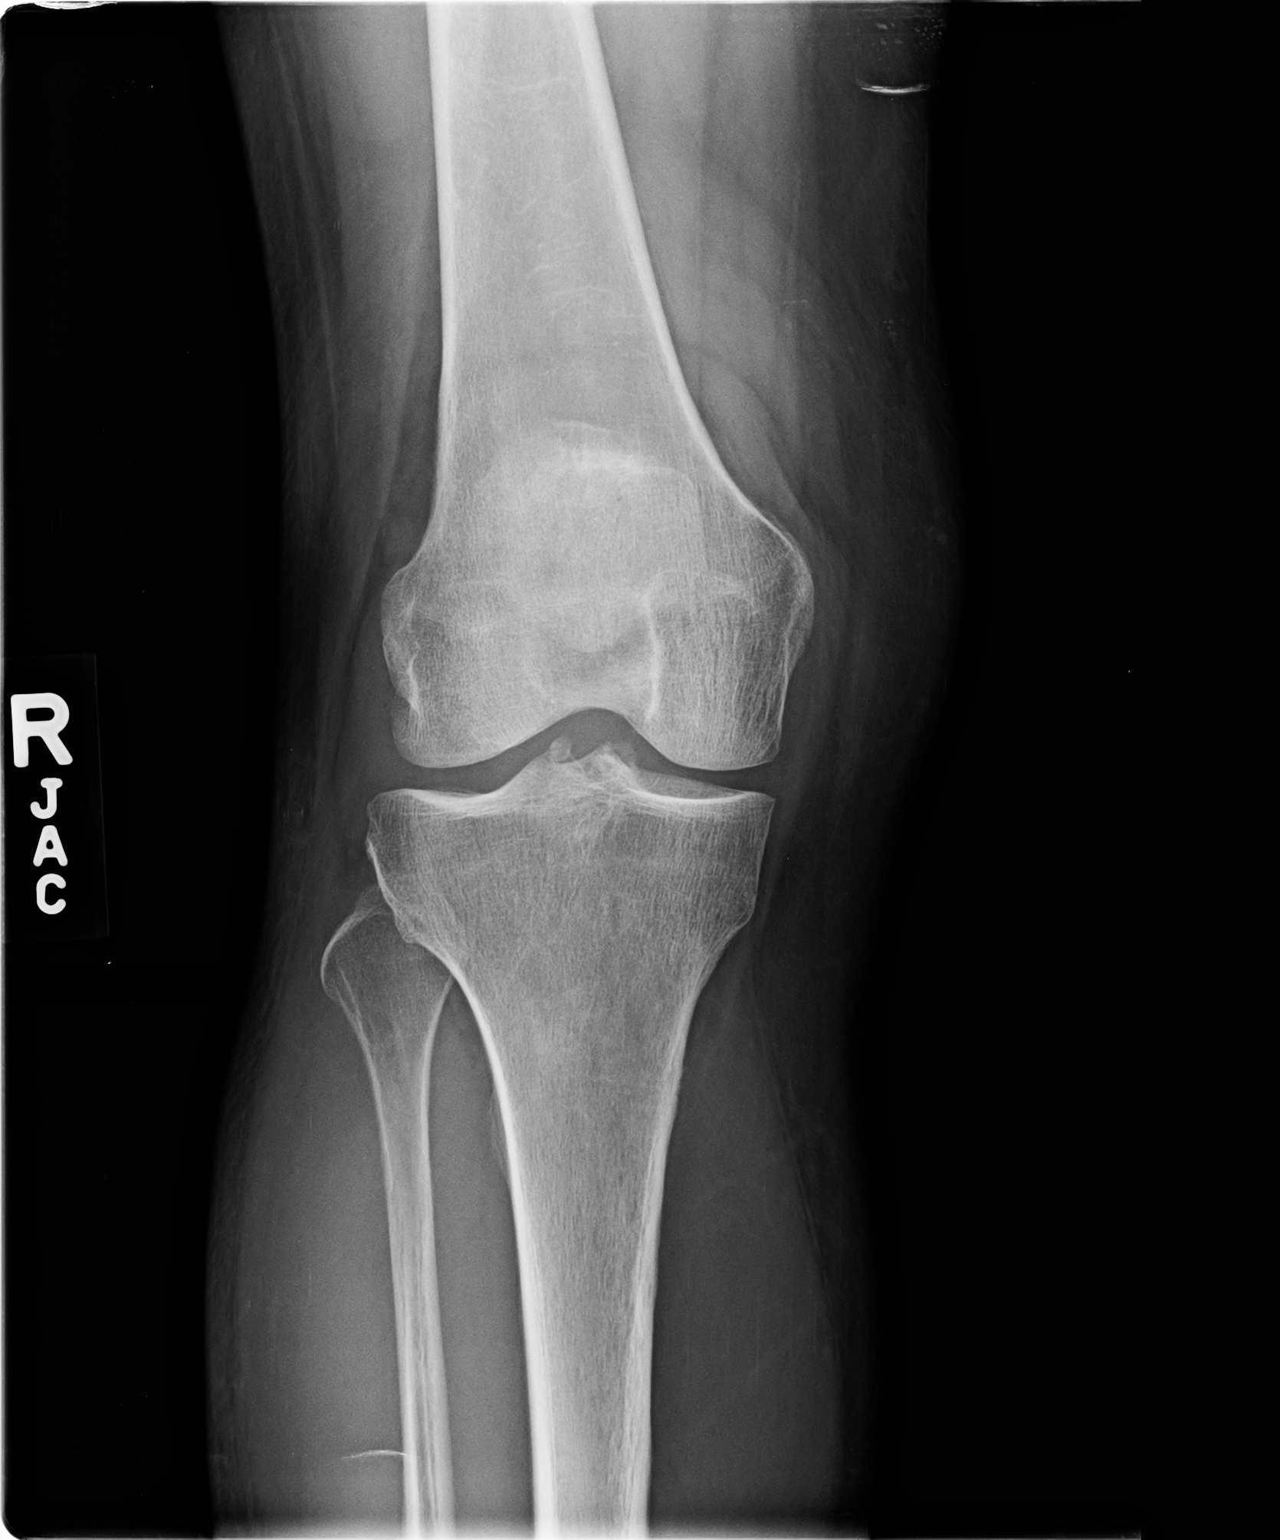

[knee lat]
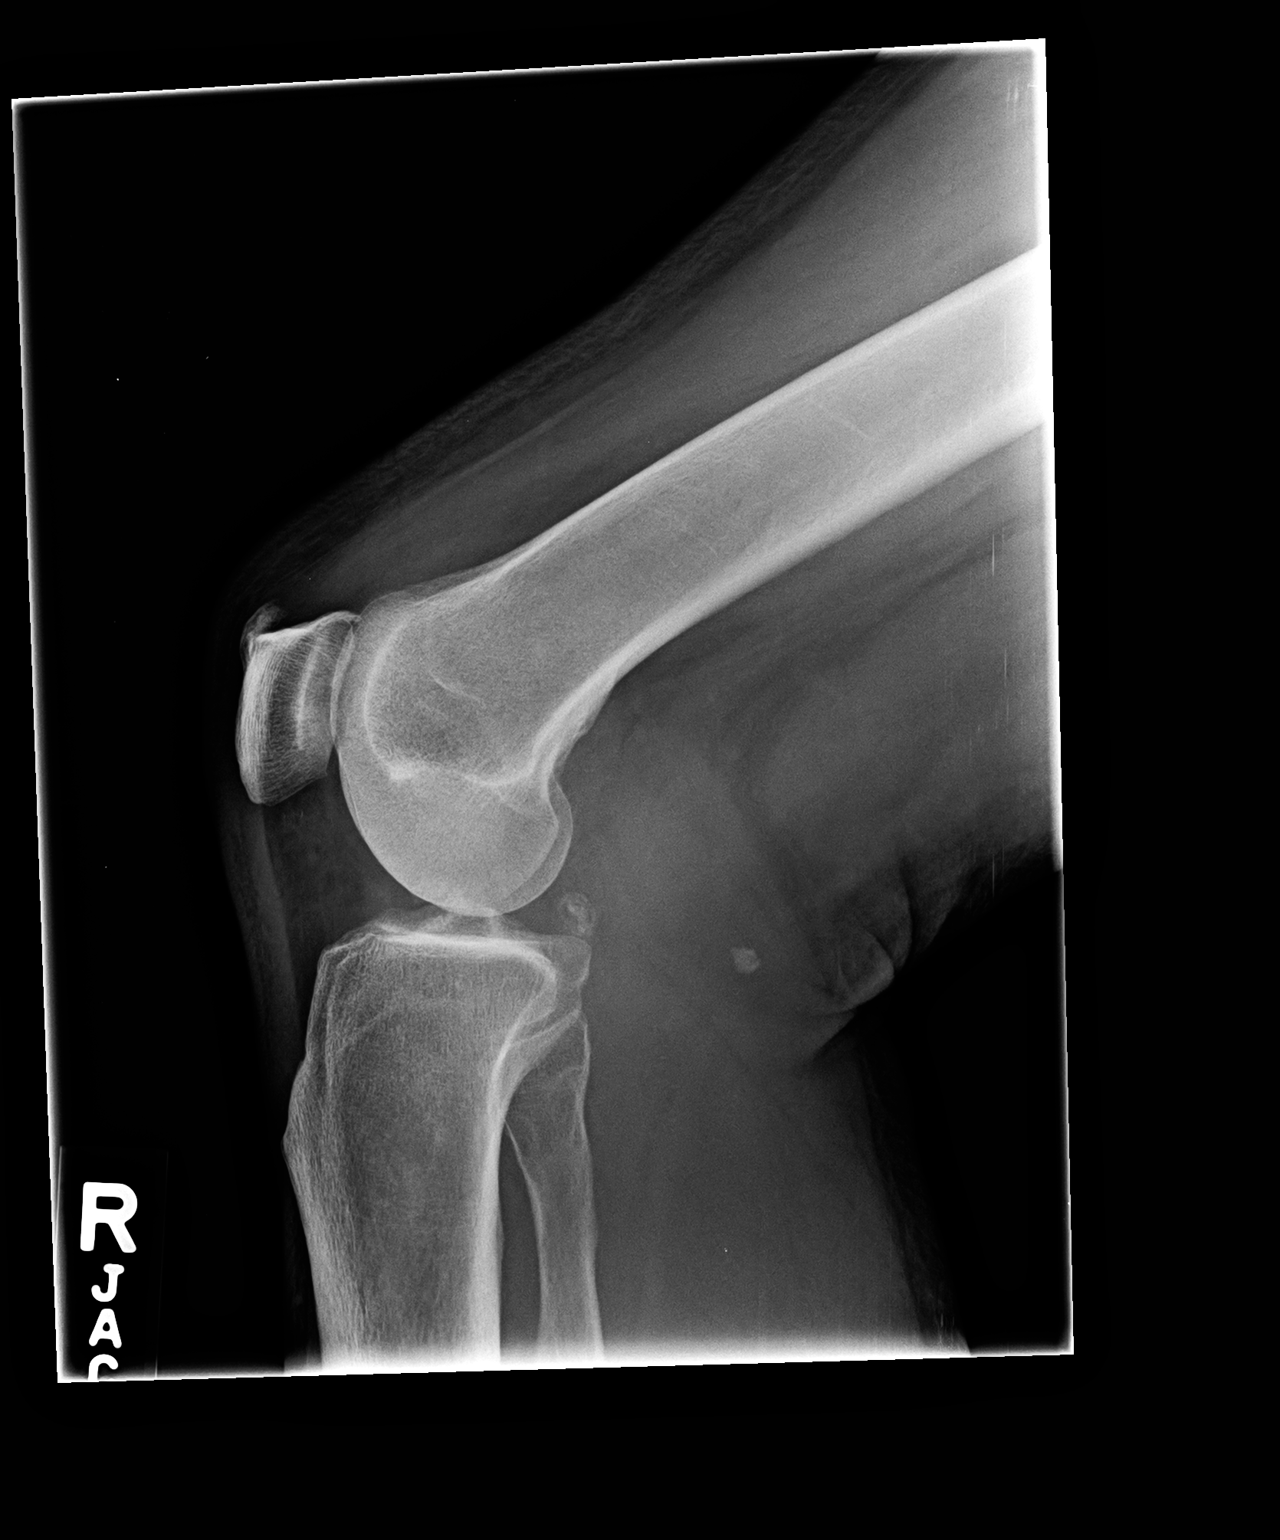

[2 of 2 positions shown; findings below may reference images not displayed]

EXAM

XR knee RT 3V

INDICATION

Knee pain

TECHNIQUE

Three views of the right knee

COMPARISONS

None available at the time of dictation.

FINDINGS

Small suprapatellar joint effusion. Small quadriceps insertional enthesophyte. Osteophytes over the
medial and lateral tibial spines. No radiographic evidence of significant joint space loss. No
significant weight-bearing compartment joint space loss. Minimal lateral patellar subluxation.

IMPRESSION
1. No radiographic evidence of an acute osseous abnormality.
2. Small suprapatellar joint effusion.

Tech Notes:

PT C/O RIGHT KNEE PAIN X 2 WKS,SUB PATELLA. NKI. JC/TJ

## 2019-07-21 IMAGING — CR LOW_EXM
3 series · 3 of 3 positions shown · non-contrast
Comparison: none

[foot]
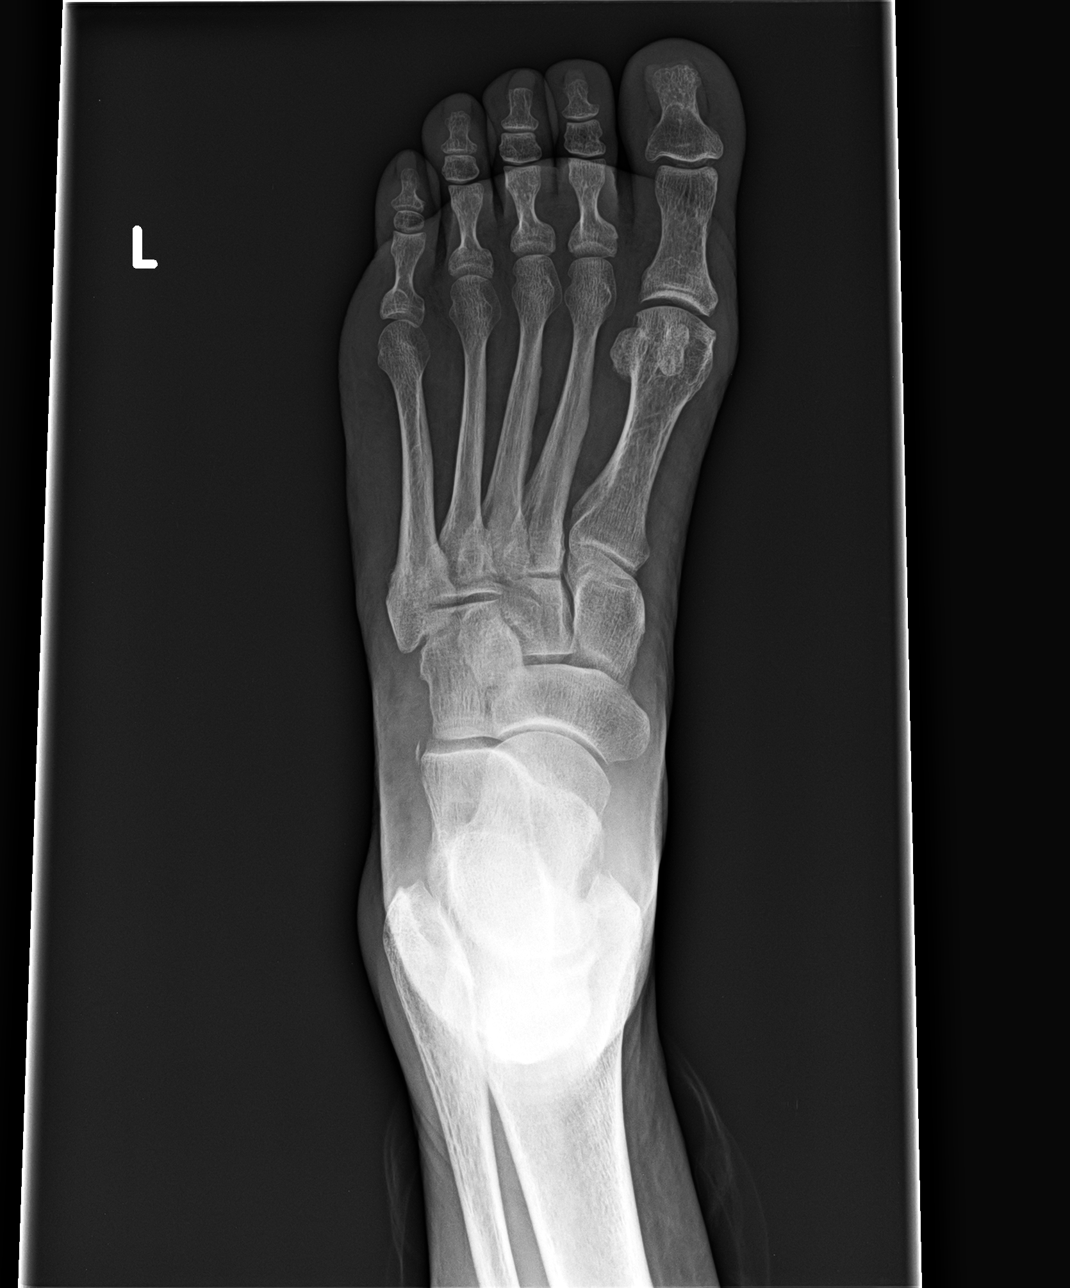

[foot obl]
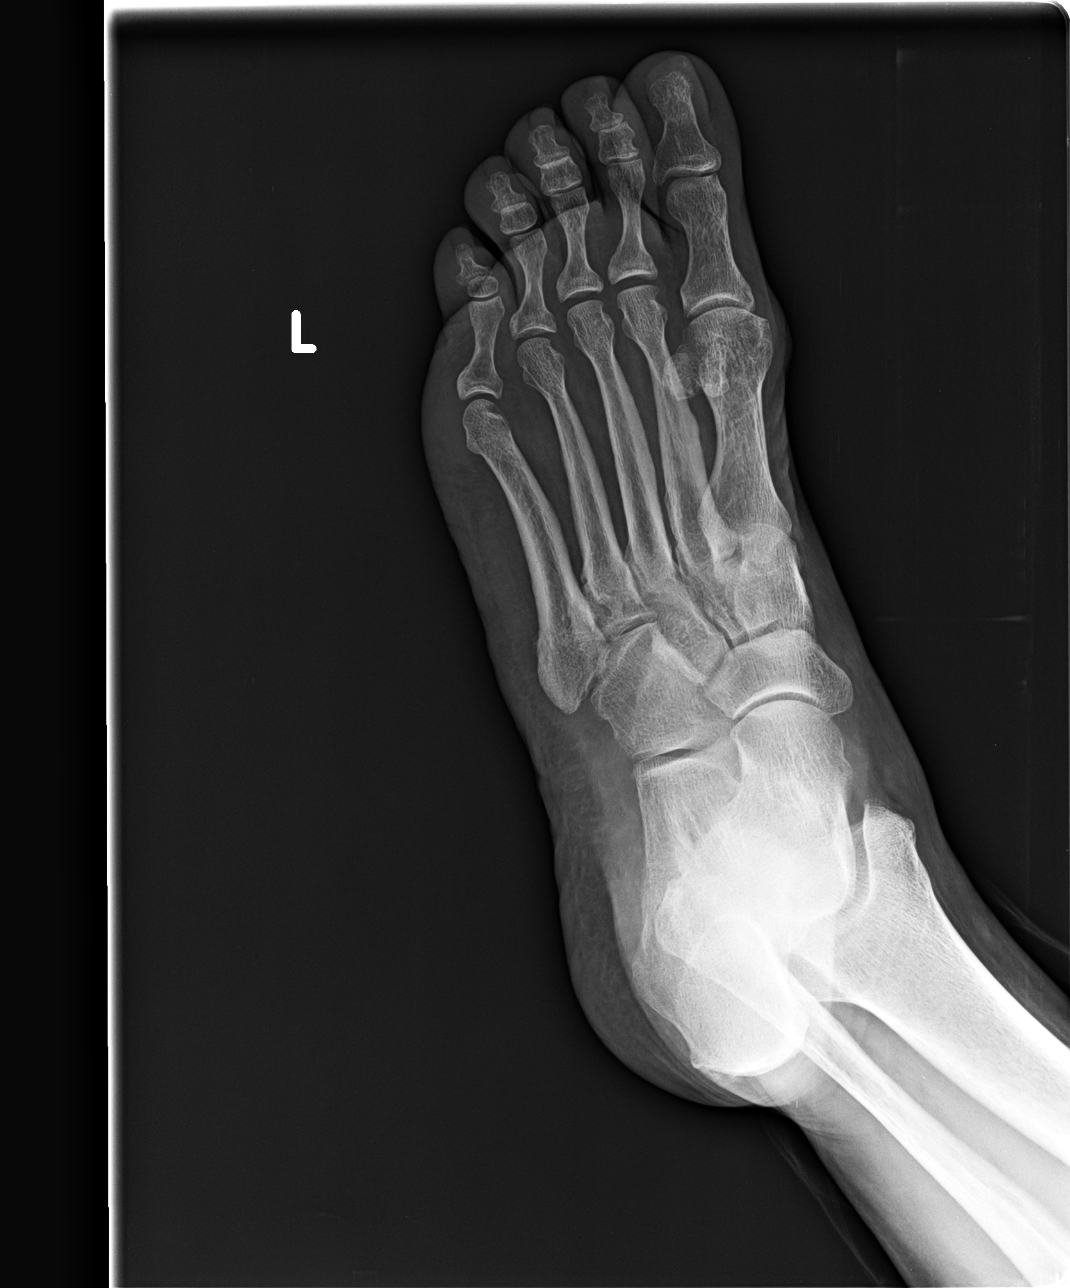

[foot lat]
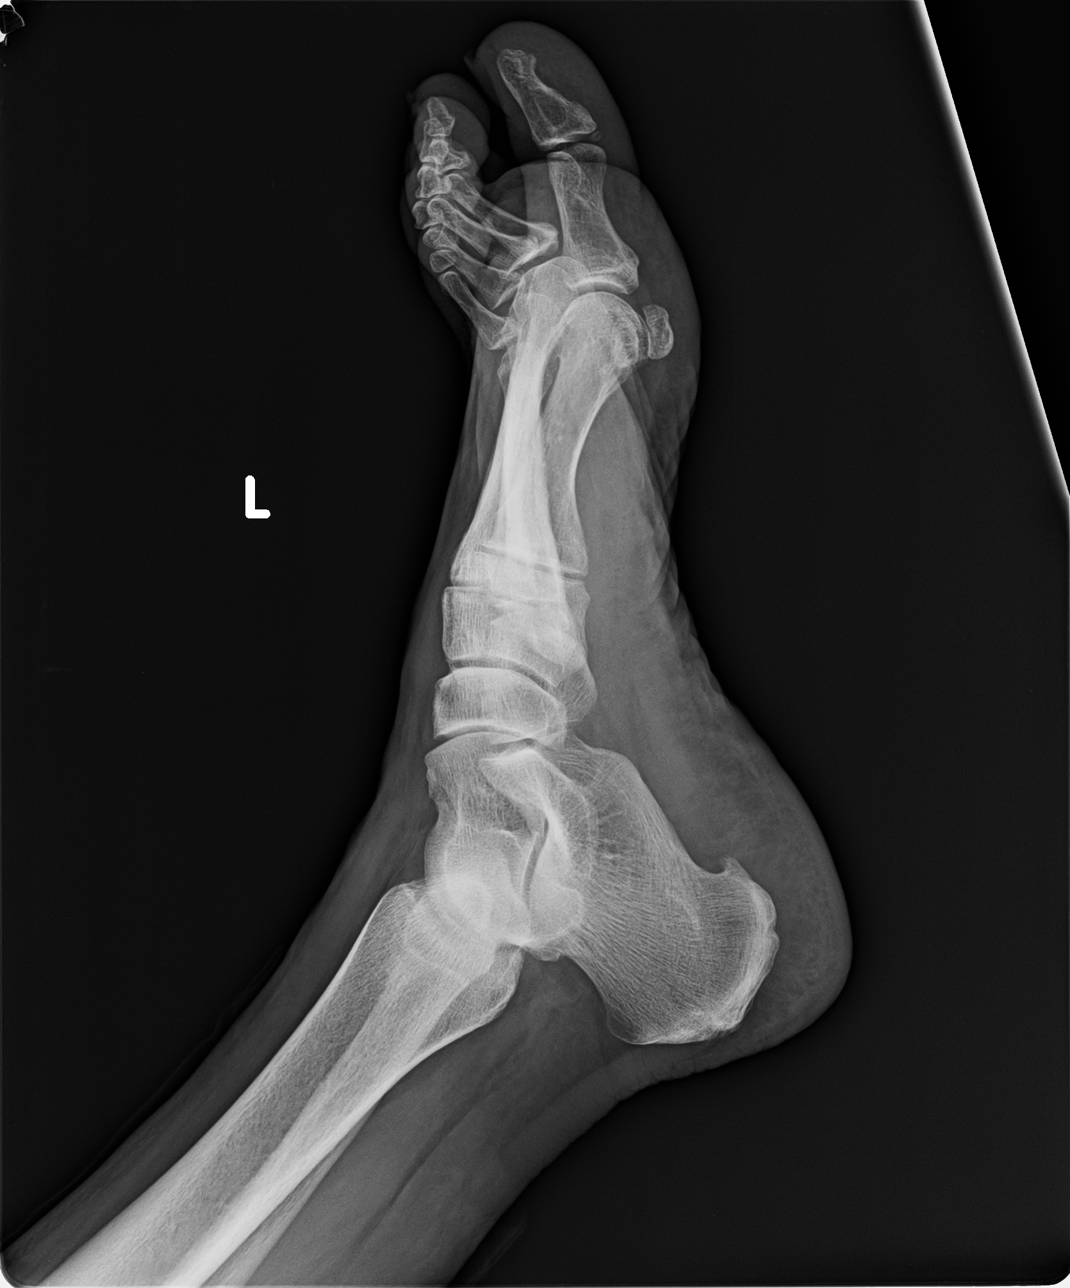

[3 of 3 positions shown; findings below may reference images not displayed]

DIAGNOSTIC STUDIES

EXAM

XR foot LT min 3V

INDICATION

fall/pain
PT states lateral ankle pain radiating into lateral foot. Pt states injury x yesterday. JC

TECHNIQUE

Left foot, three views.

COMPARISONS

No prior studies of the left foot are available. Compared with x-rays of the left ankle also
performed on 07/21/2019.

FINDINGS

There is a thin linear approximate 3 millimeter in length by 1 millimeter transverse ossified
density along the lateral aspect of the hindfoot to midfoot region adjacent to the lateral aspect of
the calcaneus seen on the AP view, which could represent a possible small avulsion bone fragment.
This small linear ossific density is not definitively identified on the oblique or lateral views.
The osseous structures of the left foot otherwise appear intact without discrete radiolucent
fracture lines identified. No acute dislocation is identified. Mild degenerative changes involving
the 1st metatarsophalangeal joint. The joint spaces otherwise appear unremarkable. A small calcaneal
spur is noted arising from the undersurface of the calcaneus at the proximal plantar fascia
insertion.

IMPRESSION
1. Small linear ossific density seen along the lateral aspect of the hindfoot to midfoot region
located lateral to the calcaneus, only seen on the AP view, which could possibly represent a small
avulsion fracture. This small linear ossific density is not confirmed on the oblique or lateral
views or on the ankle study. Clinical correlation for possible point tenderness along the lateral
aspect of the calcaneus and talus may be beneficial. If further imaging evaluation is clinically
indicated, a follow-up CT study of the left ankle to include the hindfoot region could be performed
for further assessment.
2. No acute dislocation is identified.

Tech Notes:

PT states lateral ankle pain radiating into lateral foot. Pt states injury x yesterday. JC

## 2019-07-21 IMAGING — CR LOW_EXM
2 series · 2 of 2 positions shown · non-contrast
Comparison: none

[ankle lat]
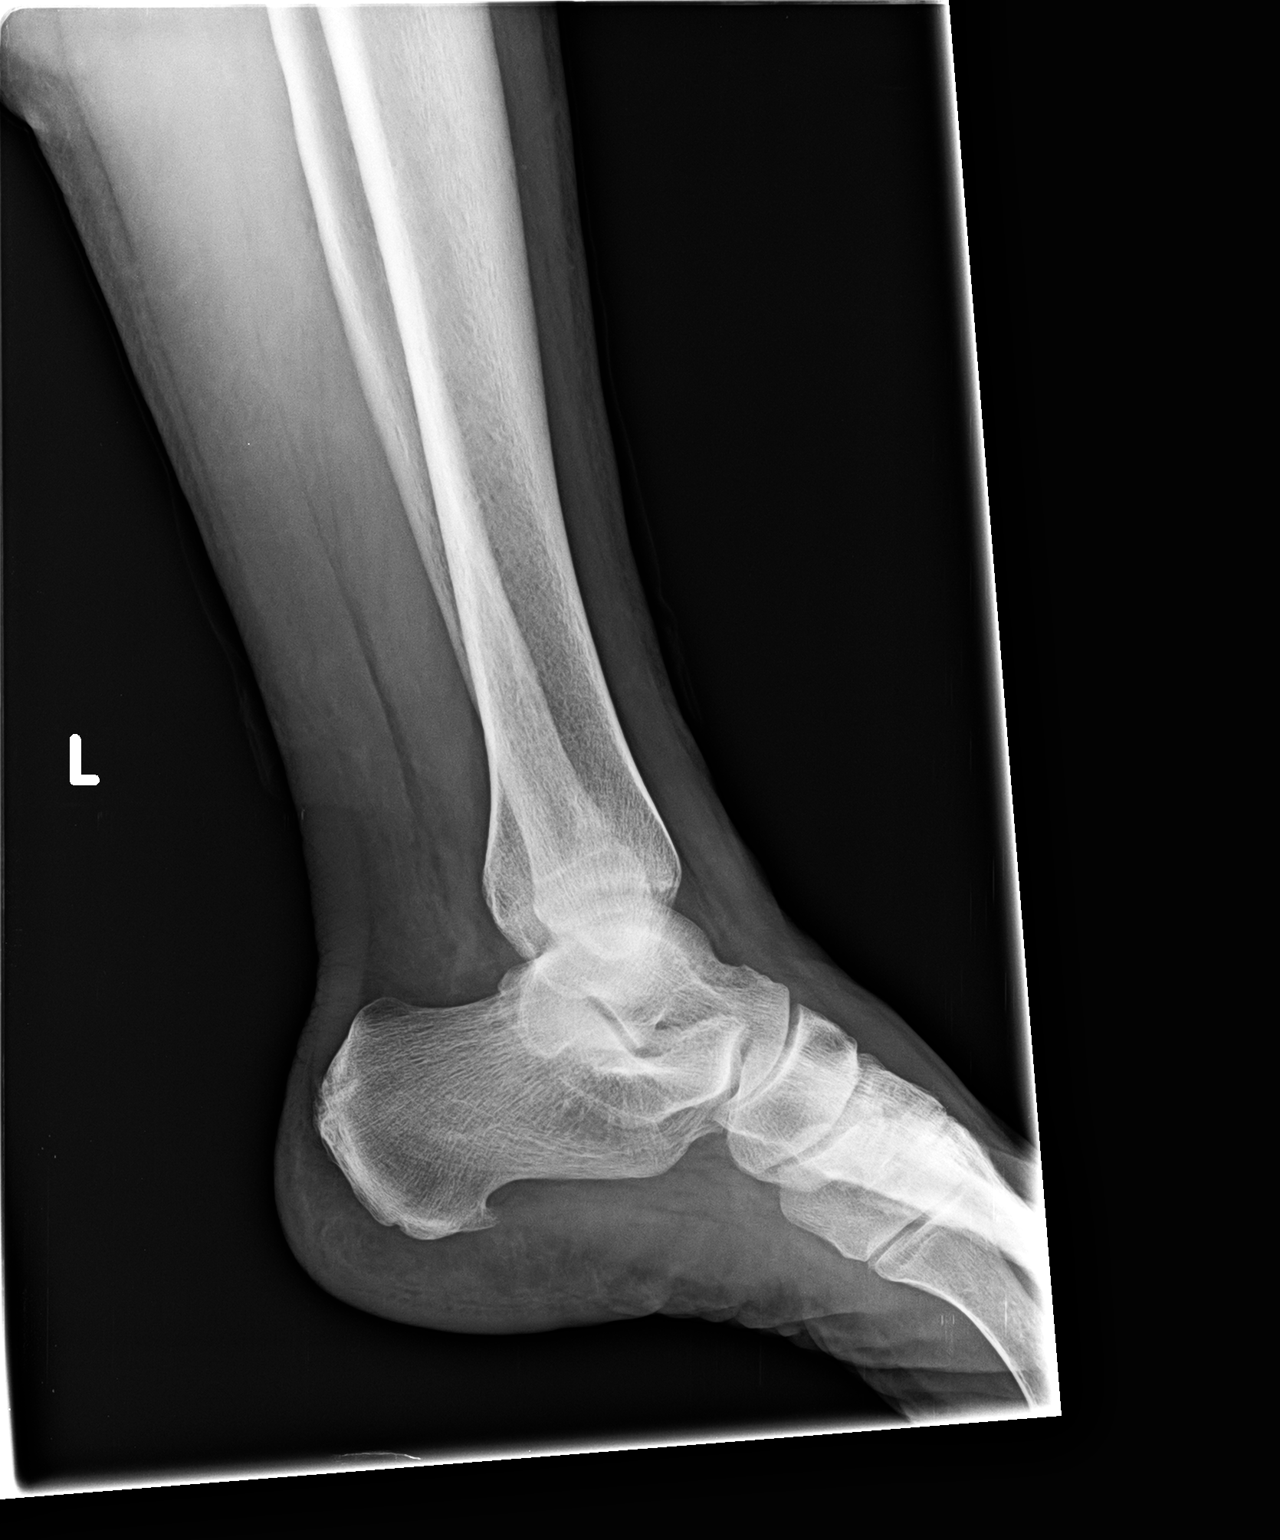

[ankle obl]
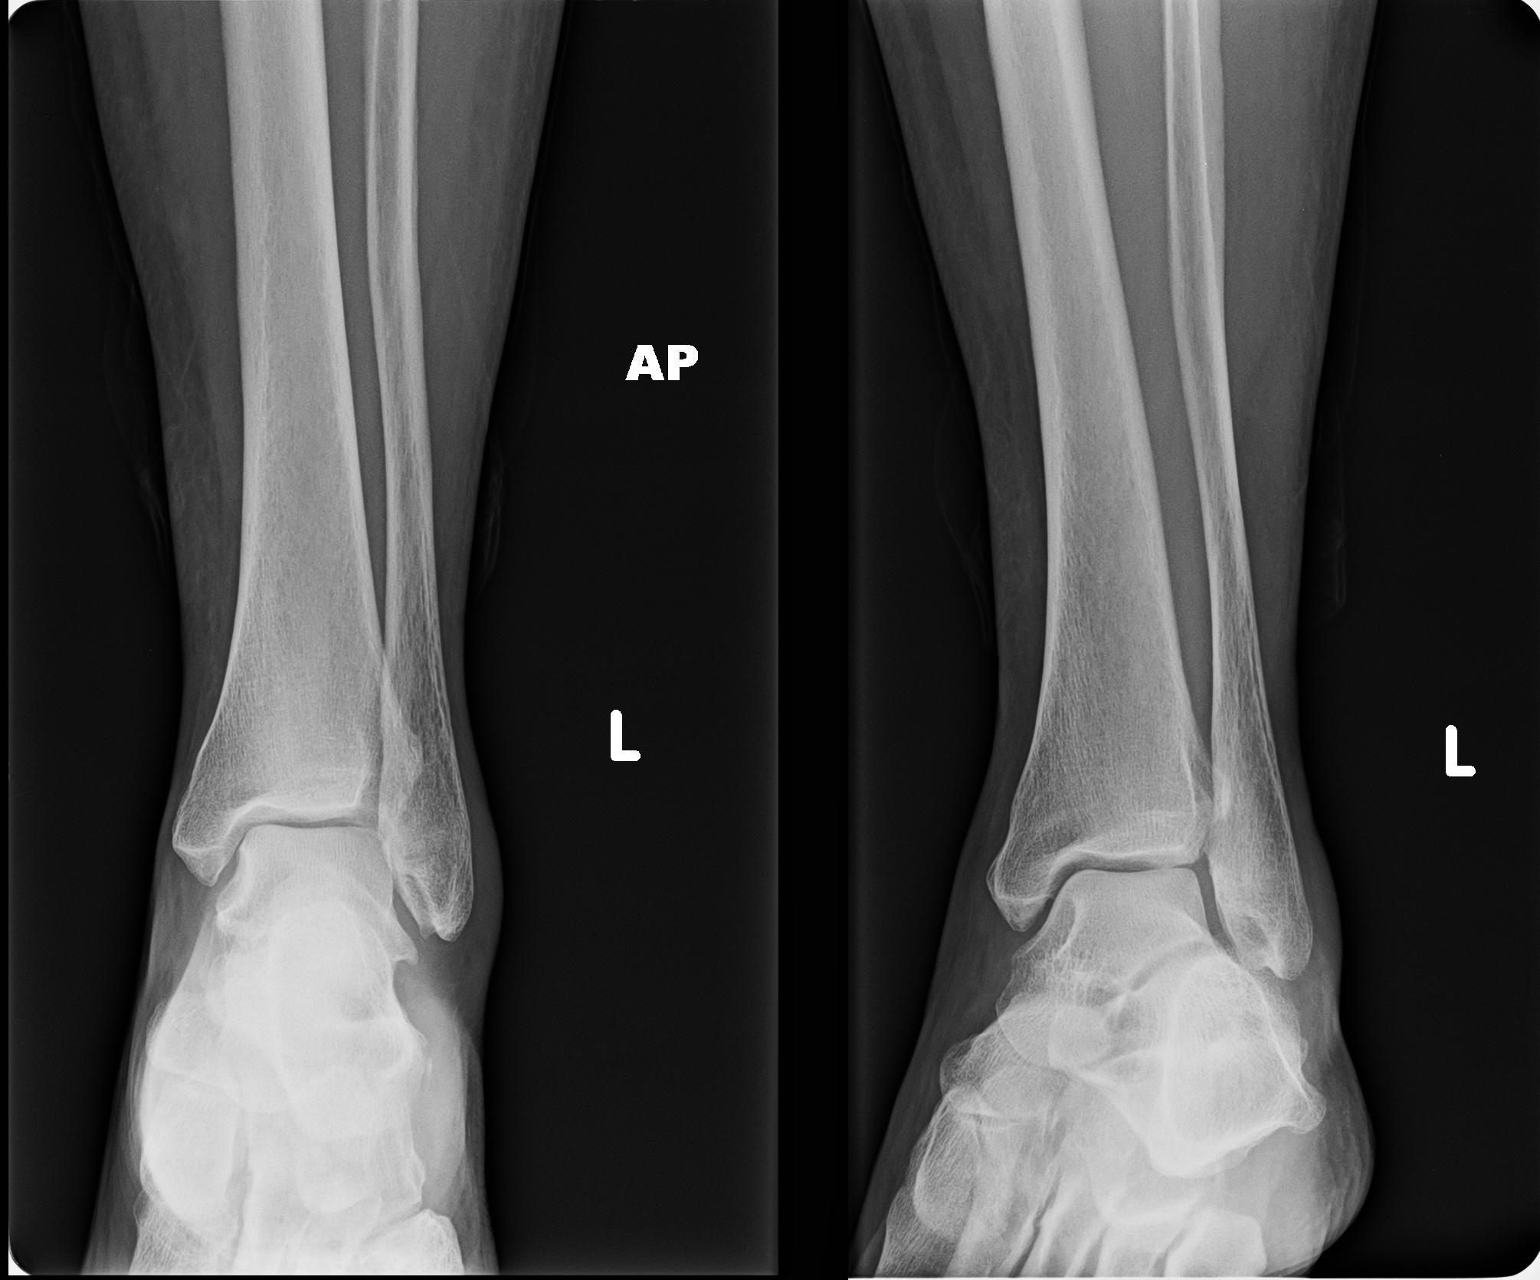

[2 of 2 positions shown; findings below may reference images not displayed]

DIAGNOSTIC STUDIES

EXAM

XR ankle LT min 3V

INDICATION

fall/pain
PT states lateral ankle pain radiating into lateral foot. Pt states injury x yesterday. JC

TECHNIQUE

Left ankle, three views.

COMPARISONS

No prior studies of the left ankle are available. This study is compared with x-rays of the left
foot also dated 07/21/2019.

FINDINGS

No acute fracture or dislocation of the left ankle is identified. The ankle mortise appears intact.
Very small calcaneal spurs of the distal Achilles tendon and proximal plantar fascia insertions.
Mild soft tissue swelling overlying the lateral malleolus.

IMPRESSION
1. No acute fracture or dislocation of the left ankle is identified.
2. Mild soft tissue swelling overlying the lateral malleolus suggesting a sprain type injury.
Clinical correlation may be beneficial.
3. Mild enthesopathic changes of the calcaneus.

Tech Notes:

PT states lateral ankle pain radiating into lateral foot. Pt states injury x yesterday. JC

## 2019-07-29 IMAGING — MR Ankle^Routine
6 of 7 series · 31 of 40 positions shown · non-contrast
Comparison: none

[Series 3: T1 · axial · 4.0mm · 0.59mm/px · z∈[-88,+32]mm · 8 of 25 slices shown (1 of 3)]
[im 1/25]
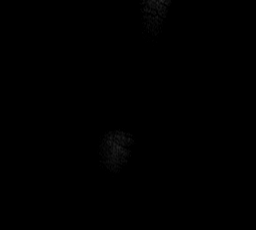
[im 4/25]
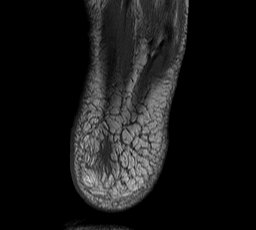
[im 7/25]
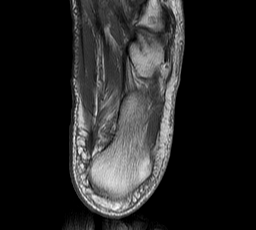
[im 11/25]
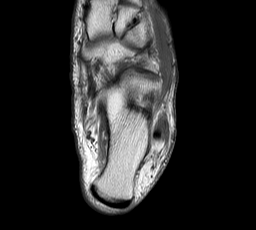
[im 14/25]
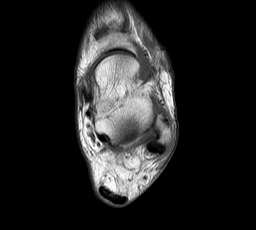
[im 18/25]
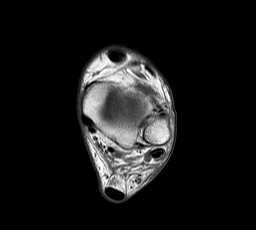
[im 21/25]
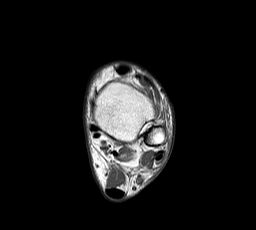
[im 25/25]
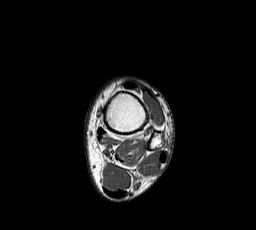

[Series 4: T2 fat-sat · axial · 3.0mm · 0.29mm/px · z∈[-69,+17]mm · 7 of 23 slices shown]
[im 1/23]
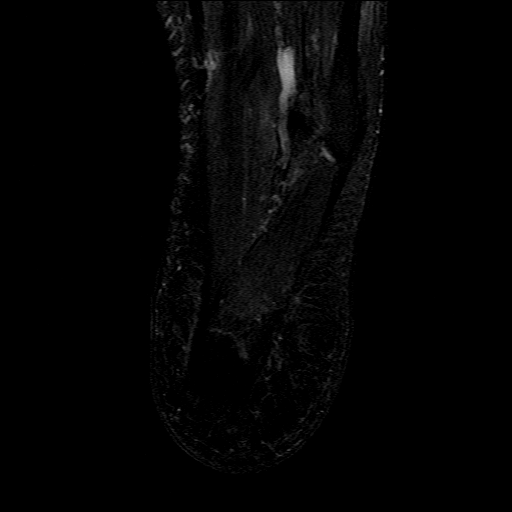
[im 4/23]
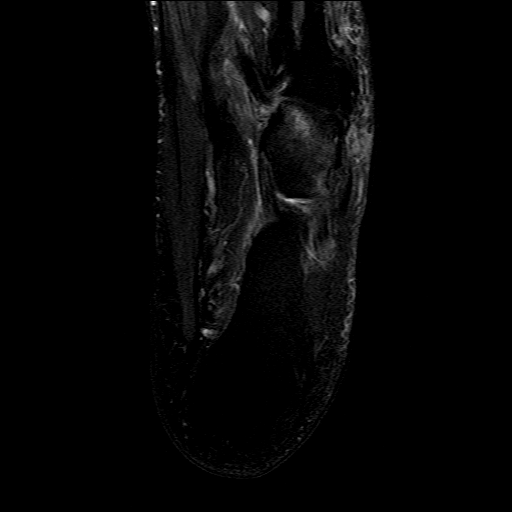
[im 8/23]
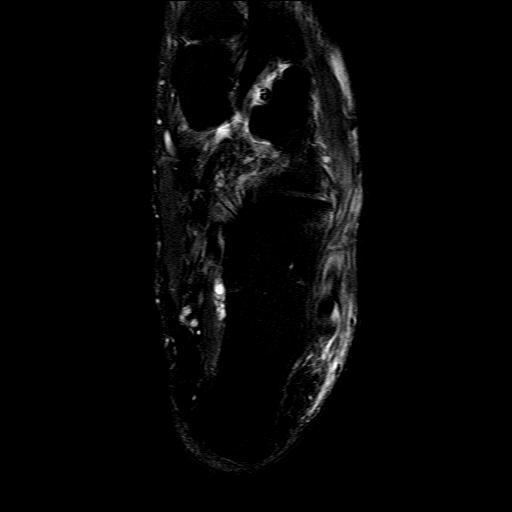
[im 12/23]
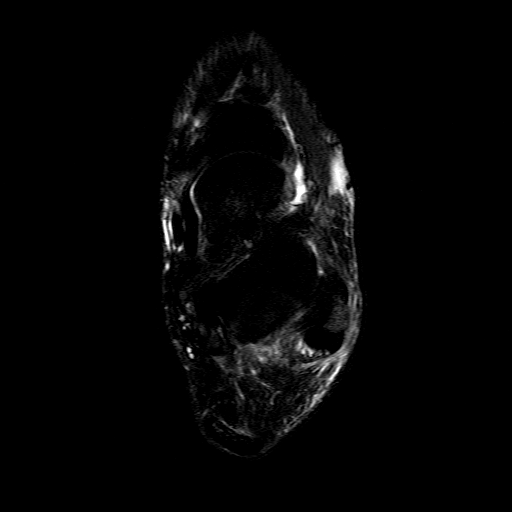
[im 15/23]
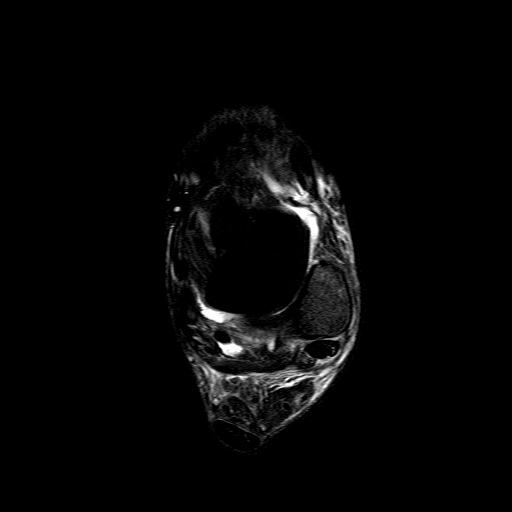
[im 19/23]
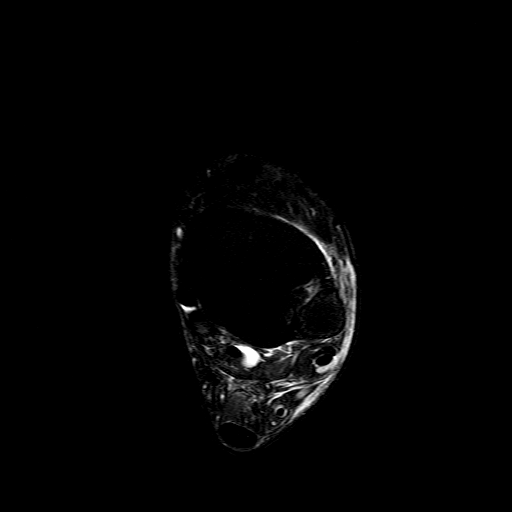
[im 23/23]
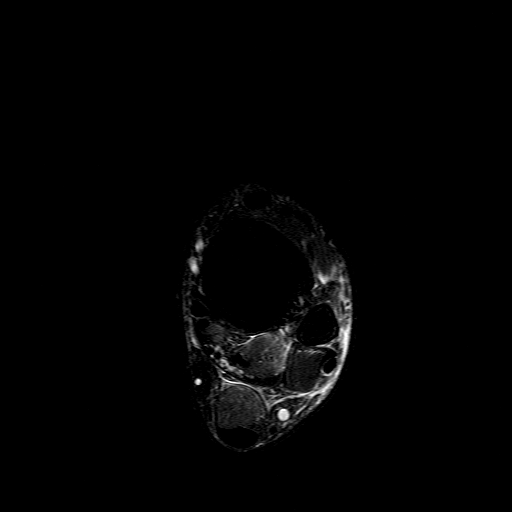

[Series 5: T1 · sagittal · 3.0mm · 0.47mm/px · 5 of 20 slices shown (2 of 3)]
[im 1/20]
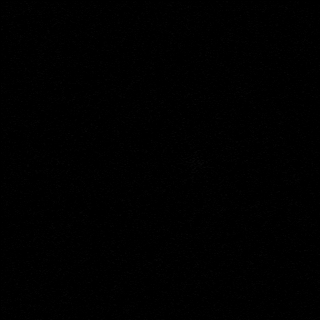
[im 5/20]
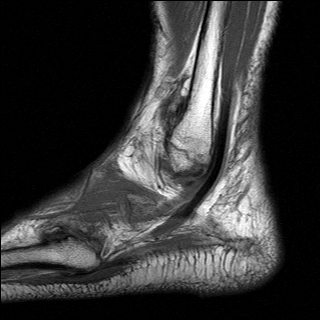
[im 10/20]
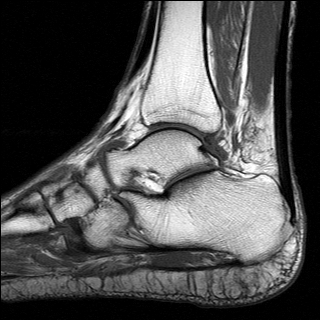
[im 15/20]
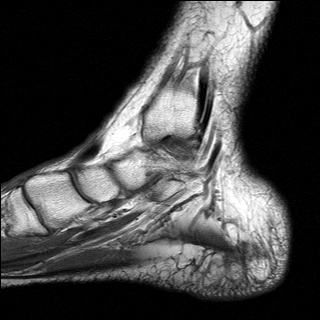
[im 20/20]
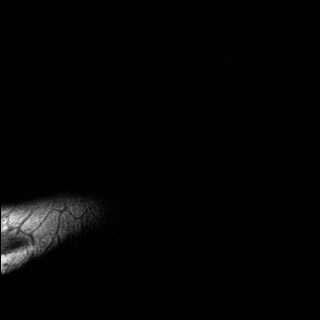

[Series 6: STIR · sagittal · 3.0mm · 0.29mm/px · 5 of 20 slices shown (1 of 2)]
[im 1/20]
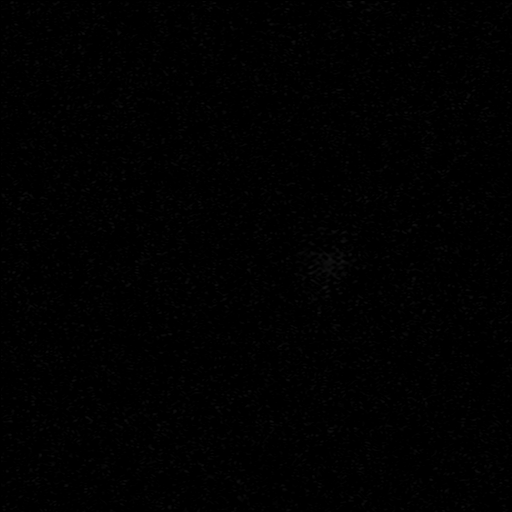
[im 5/20]
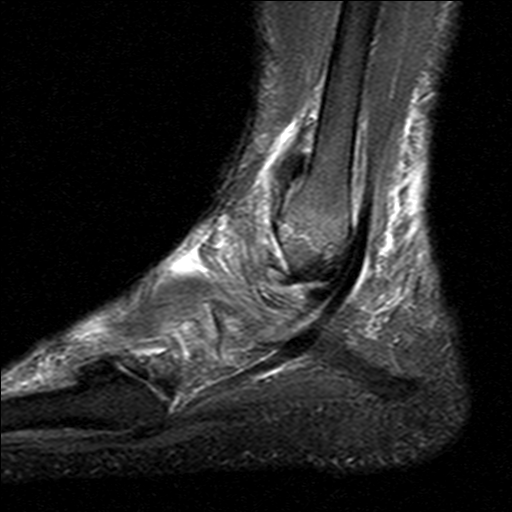
[im 10/20]
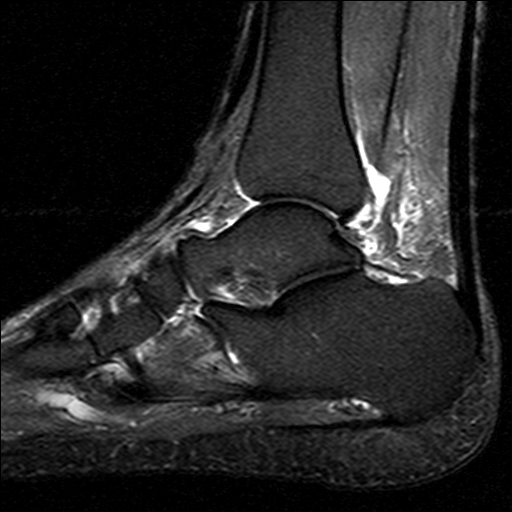
[im 15/20]
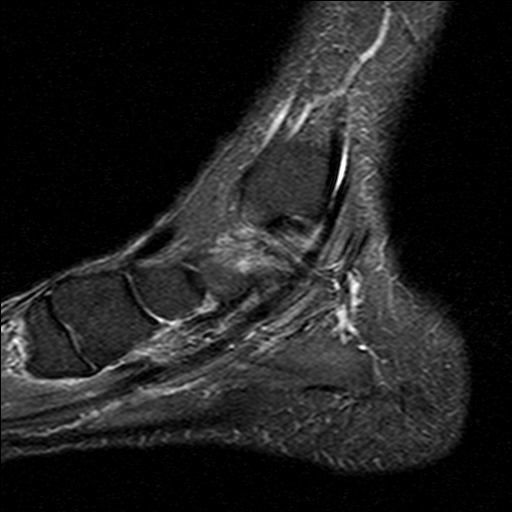
[im 20/20]
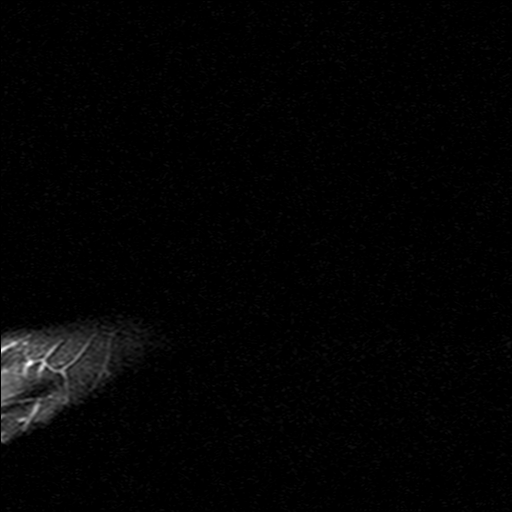

[Series 7: STIR · coronal · 3.0mm · 0.29mm/px · 1 of 20 slices shown (2 of 2)]
[im 1/20]
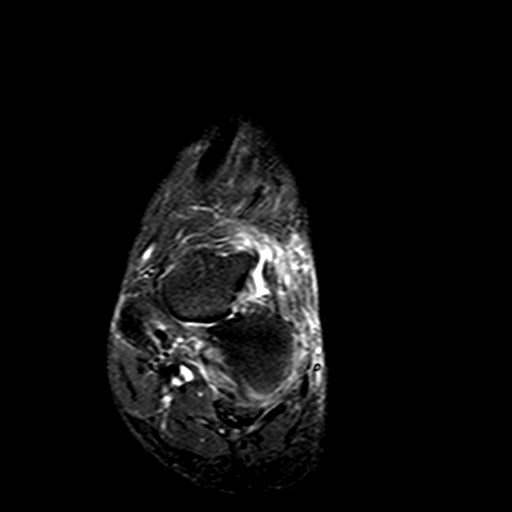

[Series 8: T1 · coronal · 4.0mm · 0.47mm/px · 5 of 20 slices shown (3 of 3)]
[im 1/20]
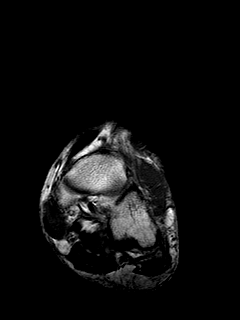
[im 5/20]
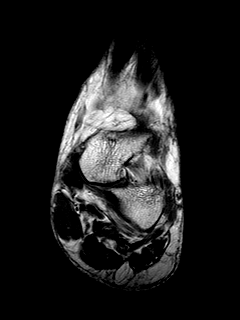
[im 10/20]
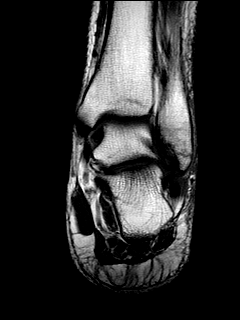
[im 15/20]
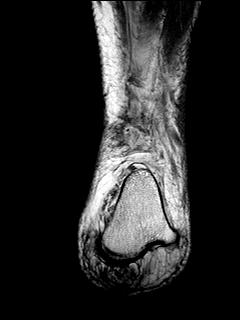
[im 20/20]
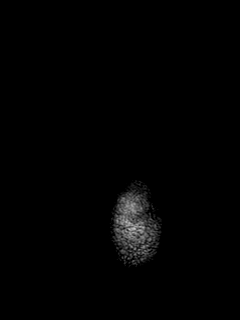

[31 of 40 positions shown; findings below may reference images not displayed]

DIAGNOSTIC STUDIES

EXAM

MRI left ankle without contrast.

INDICATION

fracture
TWISTED ANKLE [DATE], CONTINUED PAIN TO LEFT LATERAL ANKLE, CONTINUED SWELLING.  RG

TECHNIQUE

Sagittal, axial, and coronal images were obtained with variable T1 and T2 weighting.

COMPARISONS

None available

FINDINGS

There is a nondisplaced linear fracture through the lateral malleolus best seen 4 series 6. No
additional fractures are seen. There is surrounding soft tissue edema.

The Achilles tendon as well as the flexor and extensor tendons are normal. The peroneal tendons are
unremarkable.

The anterior and posterior inferior tibiofibular ligaments and anterior and posterior inferior
talofibular ligaments are within normal limits.

There is minimal marrow edema involving the cuboid likely indicating bone contusion and/or
trabecular fracture.

IMPRESSION

Nondisplaced fracture lateral malleolus with associated soft tissue edema and small ankle effusion.

Minimal marrow edema involving the cuboid as described.

Tech Notes:

TWISTED ANKLE [DATE], CONTINUED PAIN TO LEFT LATERAL ANKLE, CONTINUED SWELLING.  RG
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
## 2020-04-01 IMAGING — CR PELVIS
3 series · 3 of 3 positions shown · non-contrast
Comparison: none

[sacrum-coccyx ap (1 of 2)]
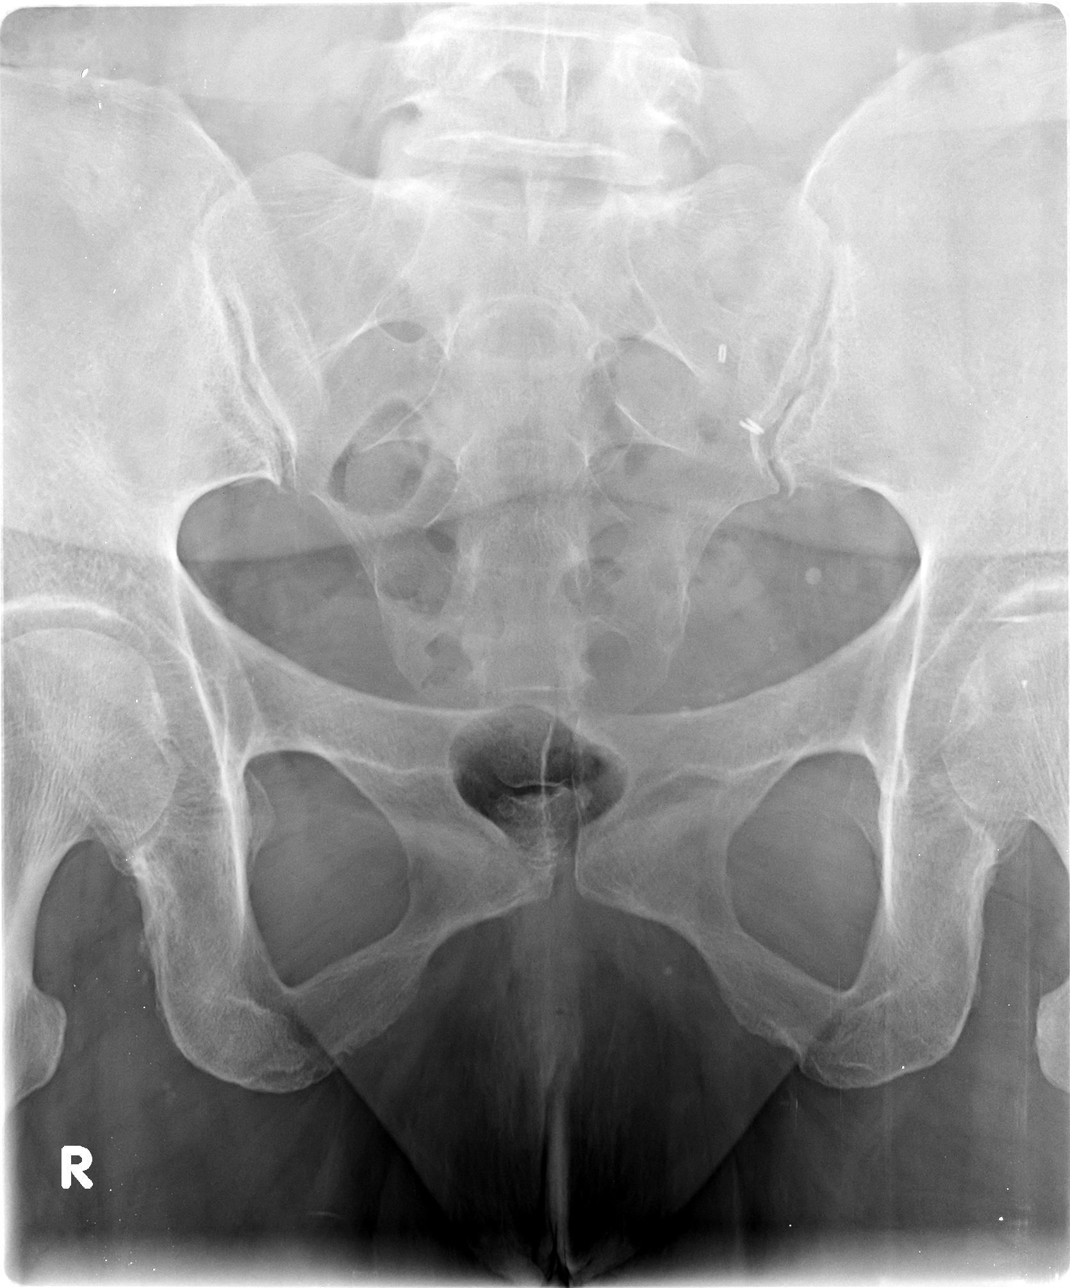

[sacrum-coccyx ap (2 of 2)]
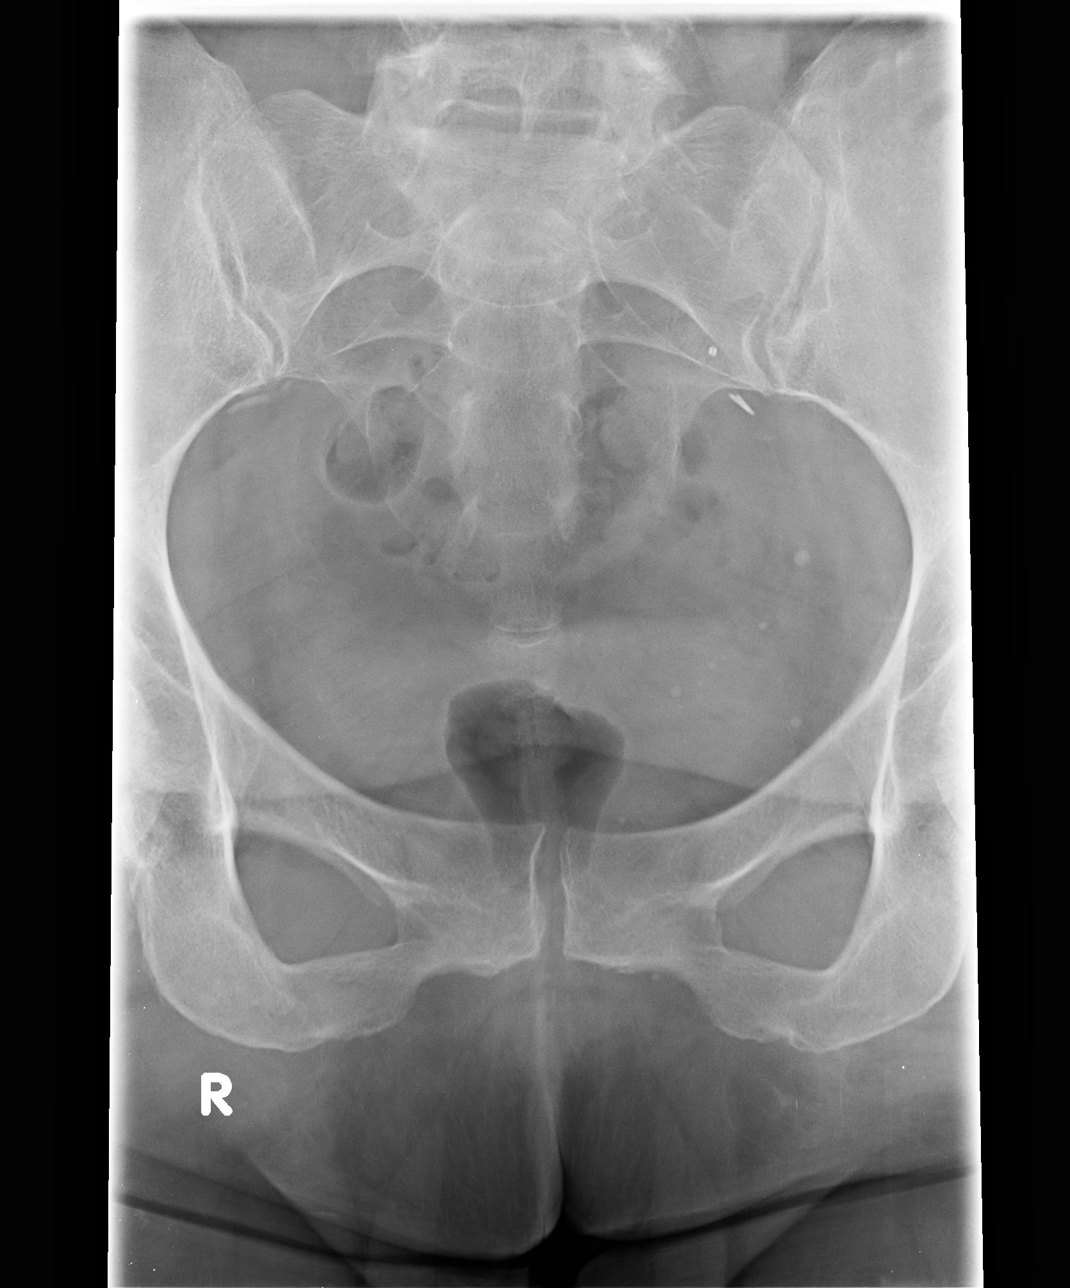

[sacrum-coccyx lat]
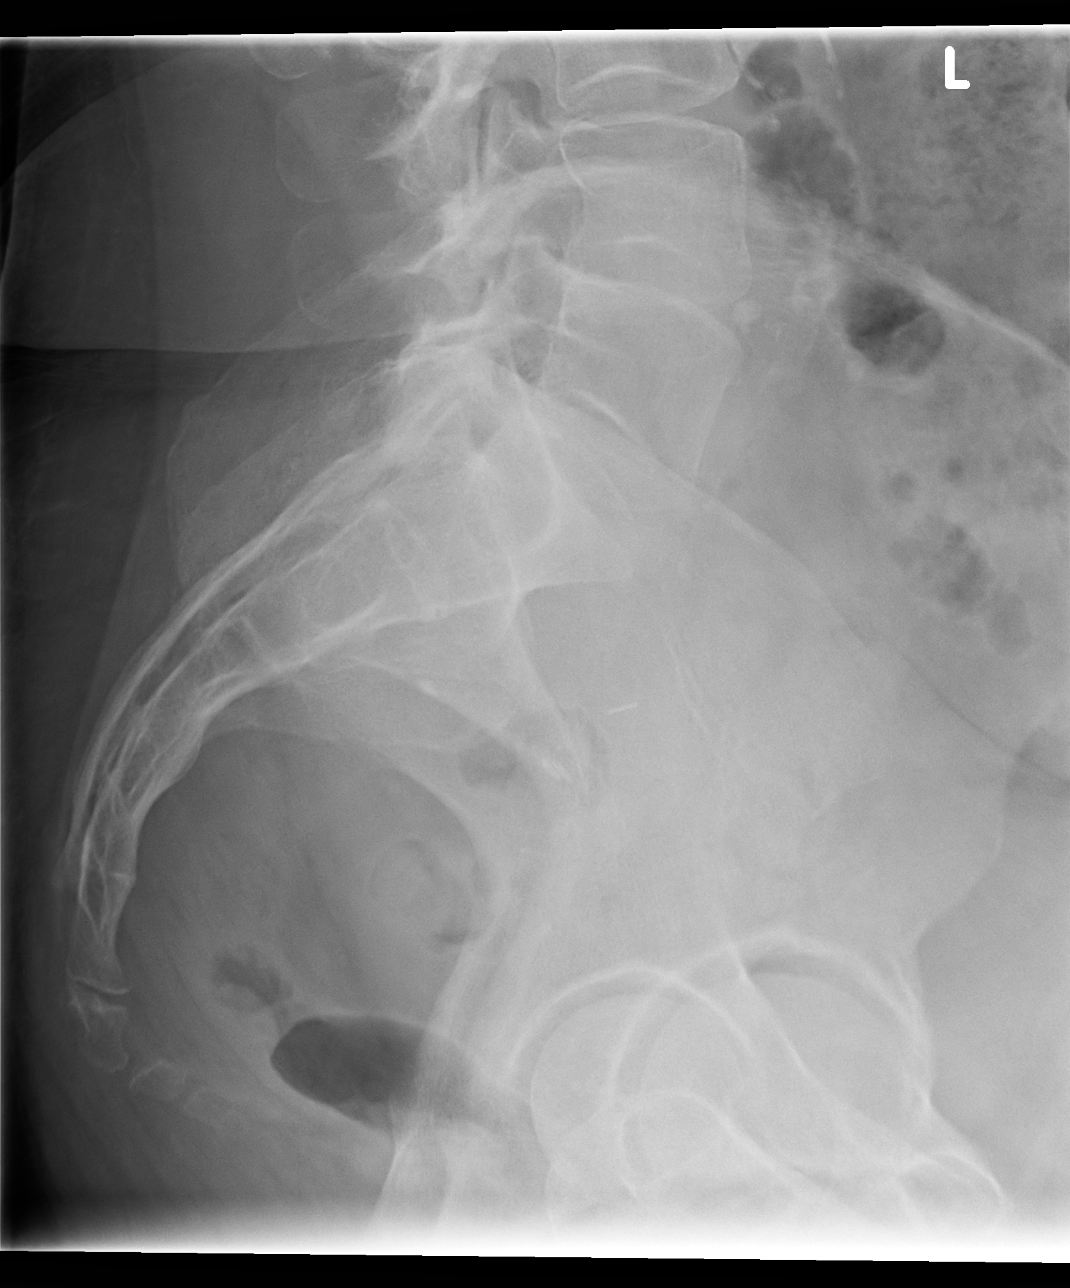

[3 of 3 positions shown; findings below may reference images not displayed]

EXAM

INDICATION

Back pain

TECHNIQUE

Three views of the sacrum/coccyx

COMPARISONS

None available at the time of dictation.

FINDINGS

Symmetric sacroiliac joints. Surgical clips in the pelvis. Minimal degenerative change of the right
sacroiliac joint.

IMPRESSION
1. No radiographic evidence of an acute osseous abnormality.

Tech Notes:

Patient complains of lower back pain that has been going on for a week and a half now. Patient also
states she had an UTI and is now having back pain. No known injury.

## 2020-04-01 IMAGING — CR PELVIS
5 series · 5 of 5 positions shown · non-contrast
Comparison: none

[l-spine ap]
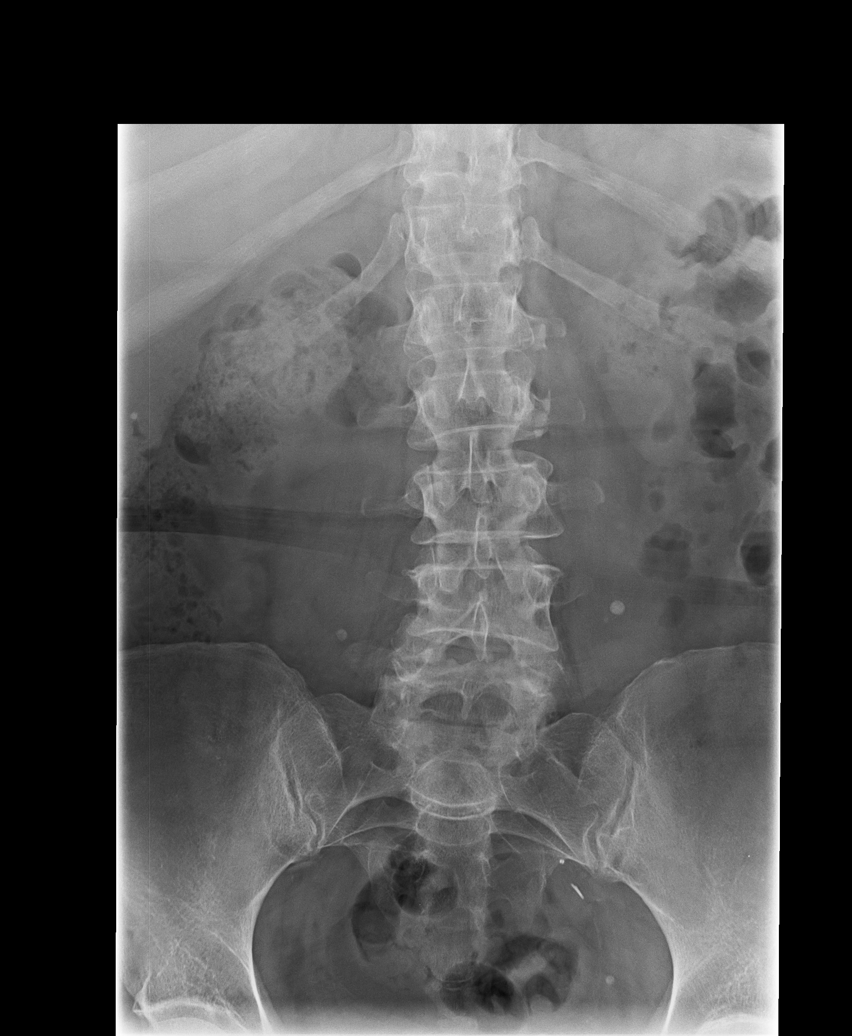

[l-spine obl (1 of 2)]
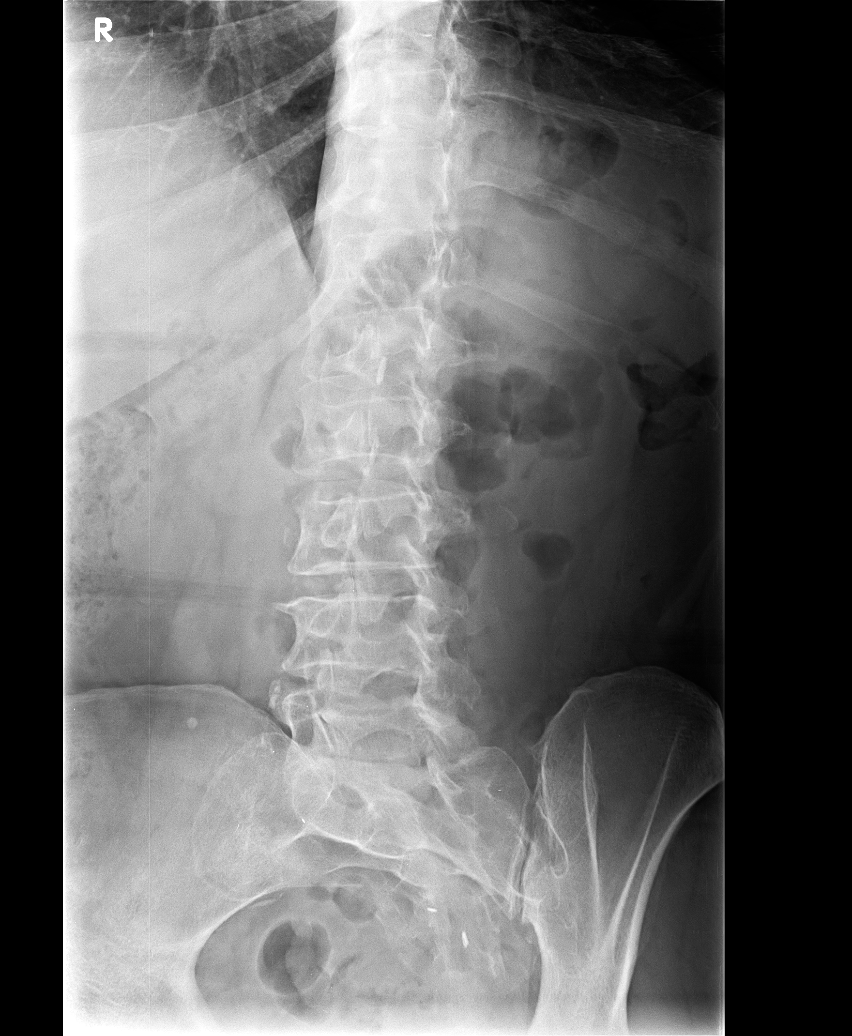

[l-spine obl (2 of 2)]
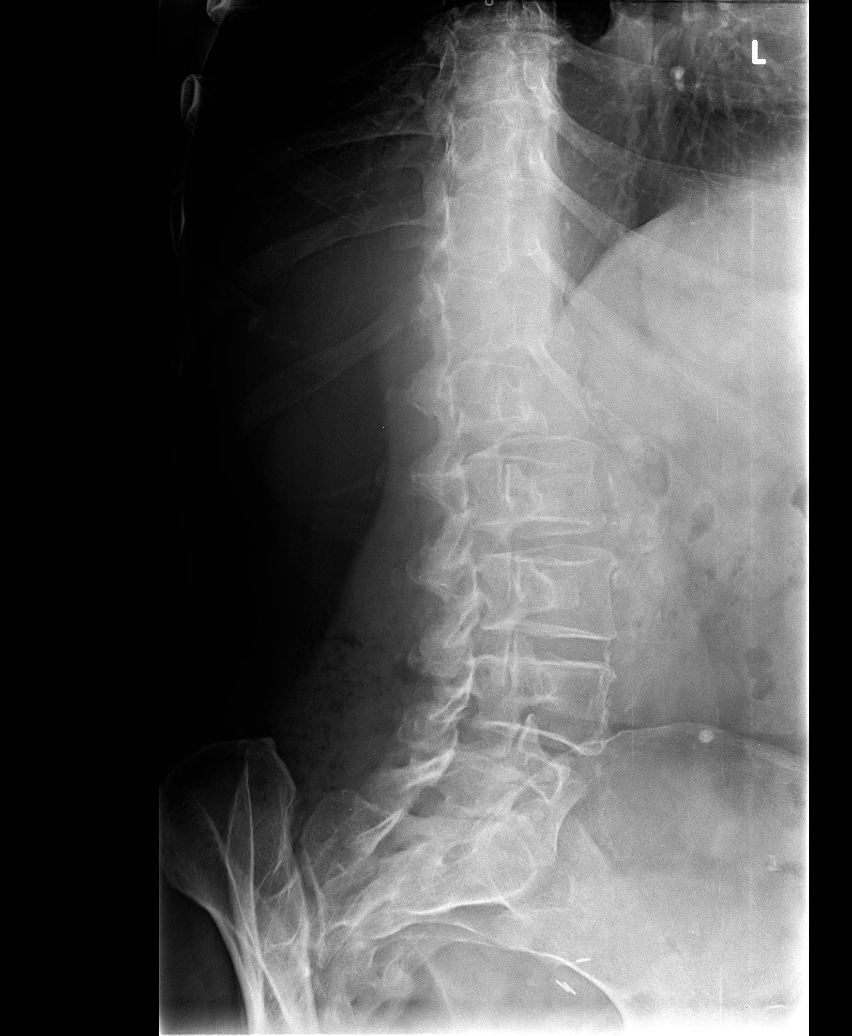

[l-spine lat]
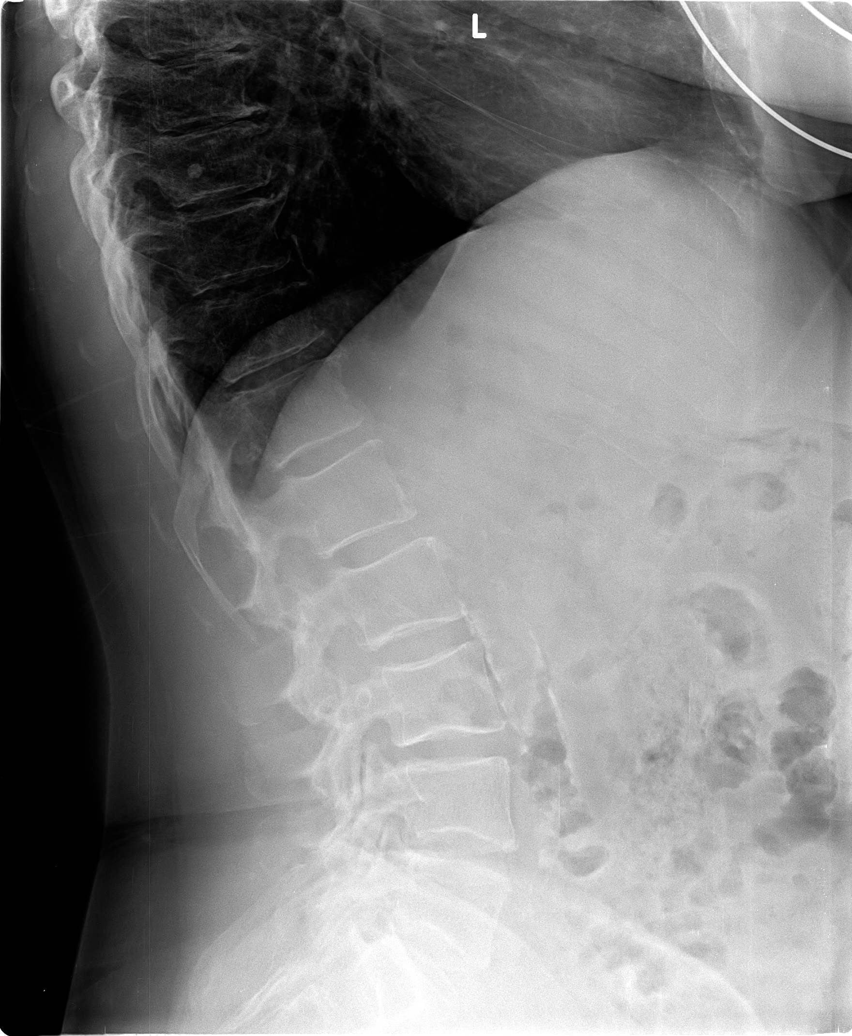

[l-spine l5-s1]
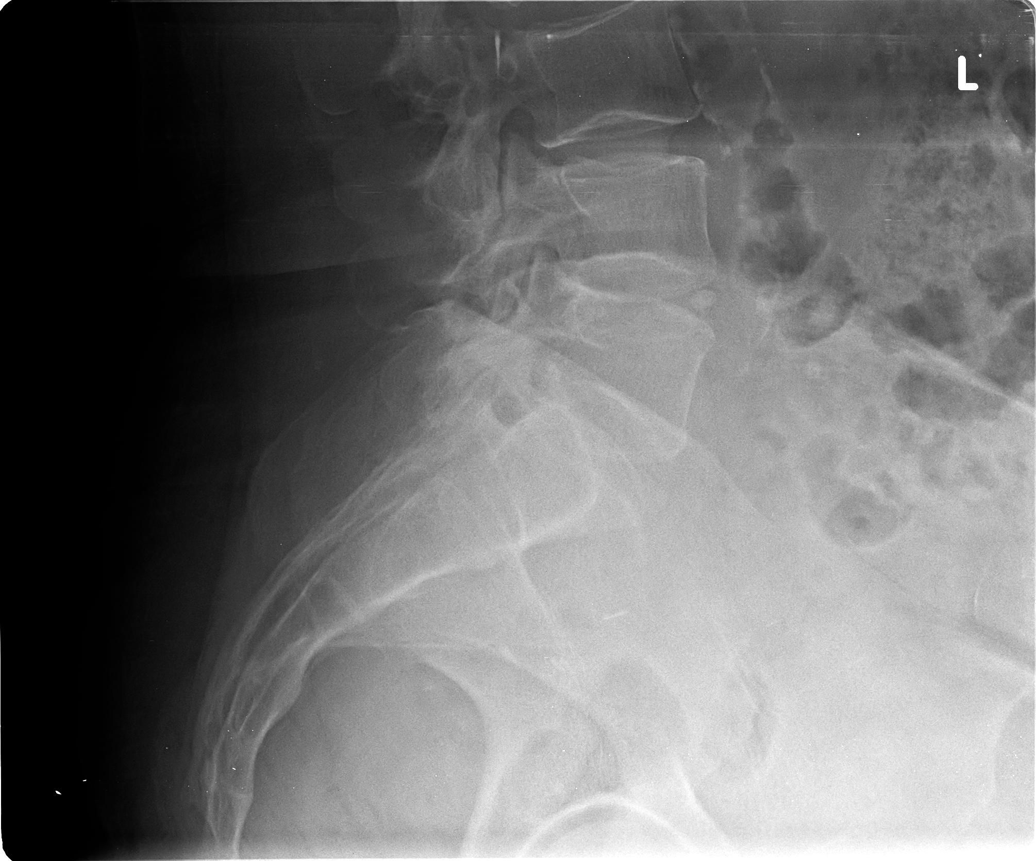

[5 of 5 positions shown; findings below may reference images not displayed]

DIAGNOSTIC STUDIES

EXAM

XR lumbar spine 5 view

INDICATION

back pain
Patient complains of lower back pain that has been going on for a week and a half now. Patient also
states she had an UTI and is now having back pain. No known injury.

TECHNIQUE

COMPARISONS

FINDINGS

Five non rib bearing lumbar type vertebral bodies. The lumbar spine is normally aligned. Normal
vertebral body height. Normal intervertebral disc spaces. No listhesis

Calcification of the abdominal aorta

IMPRESSION

No acute compression fracture

Tech Notes:

Patient complains of lower back pain that has been going on for a week and a half now. Patient also
states she had an UTI and is now having back pain. No known injury.
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
## 2020-05-07 IMAGING — CR [ID]
3 series · 3 of 3 positions shown · non-contrast
Comparison: none

[knee ap]
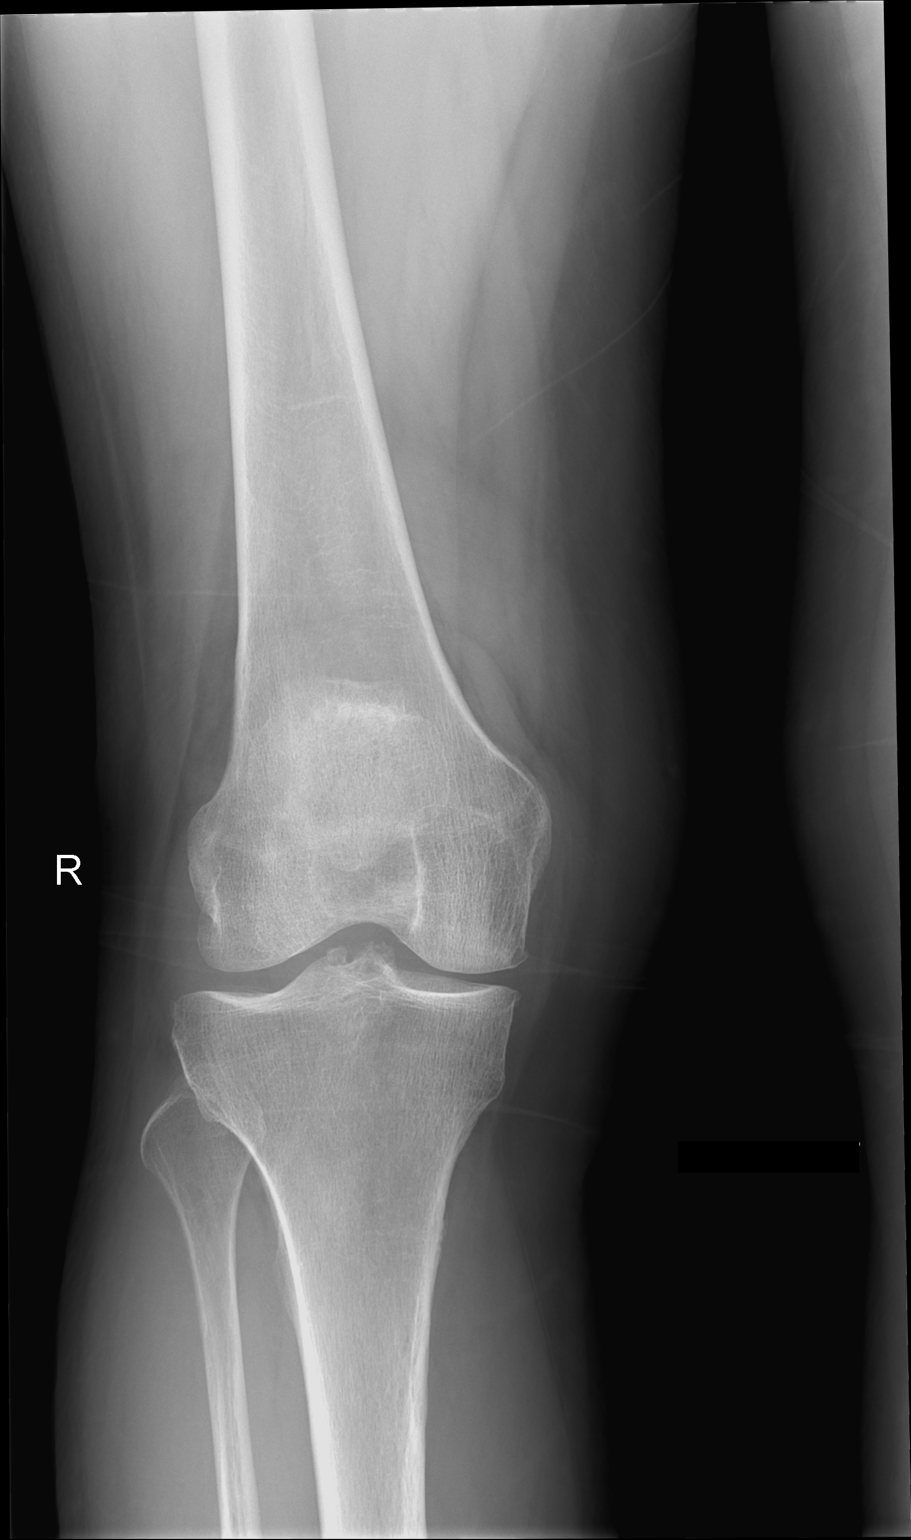

[knee sunrise]
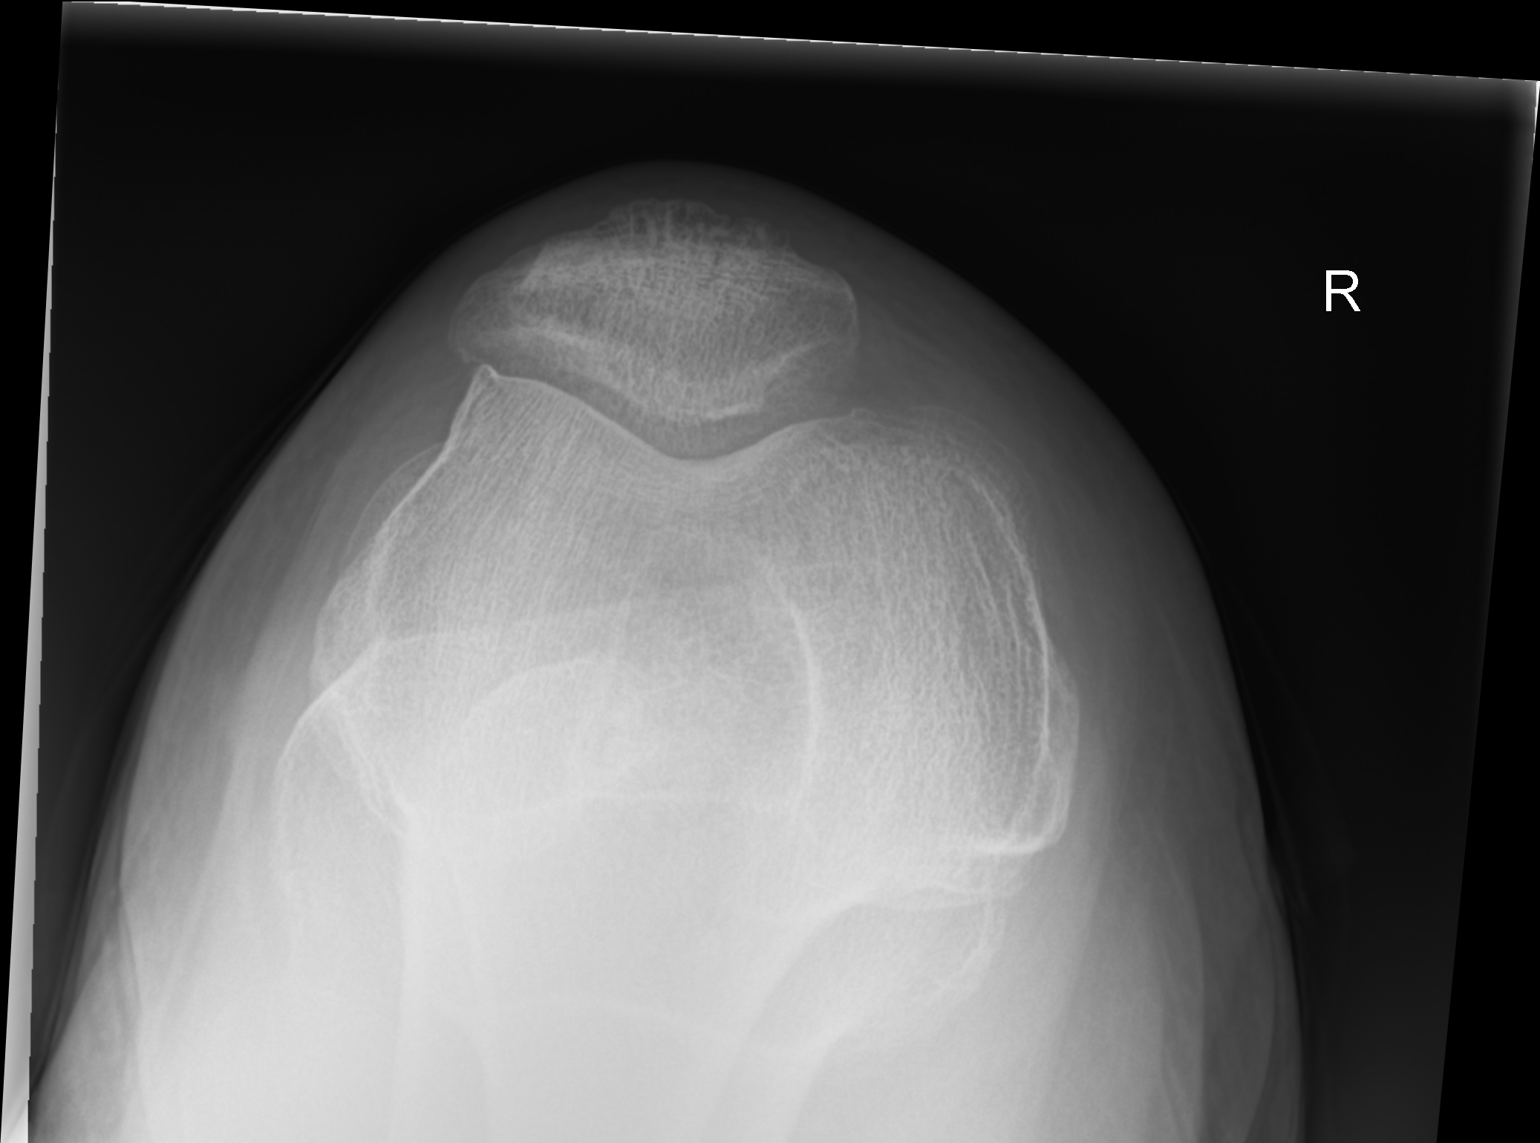

[knee lat]
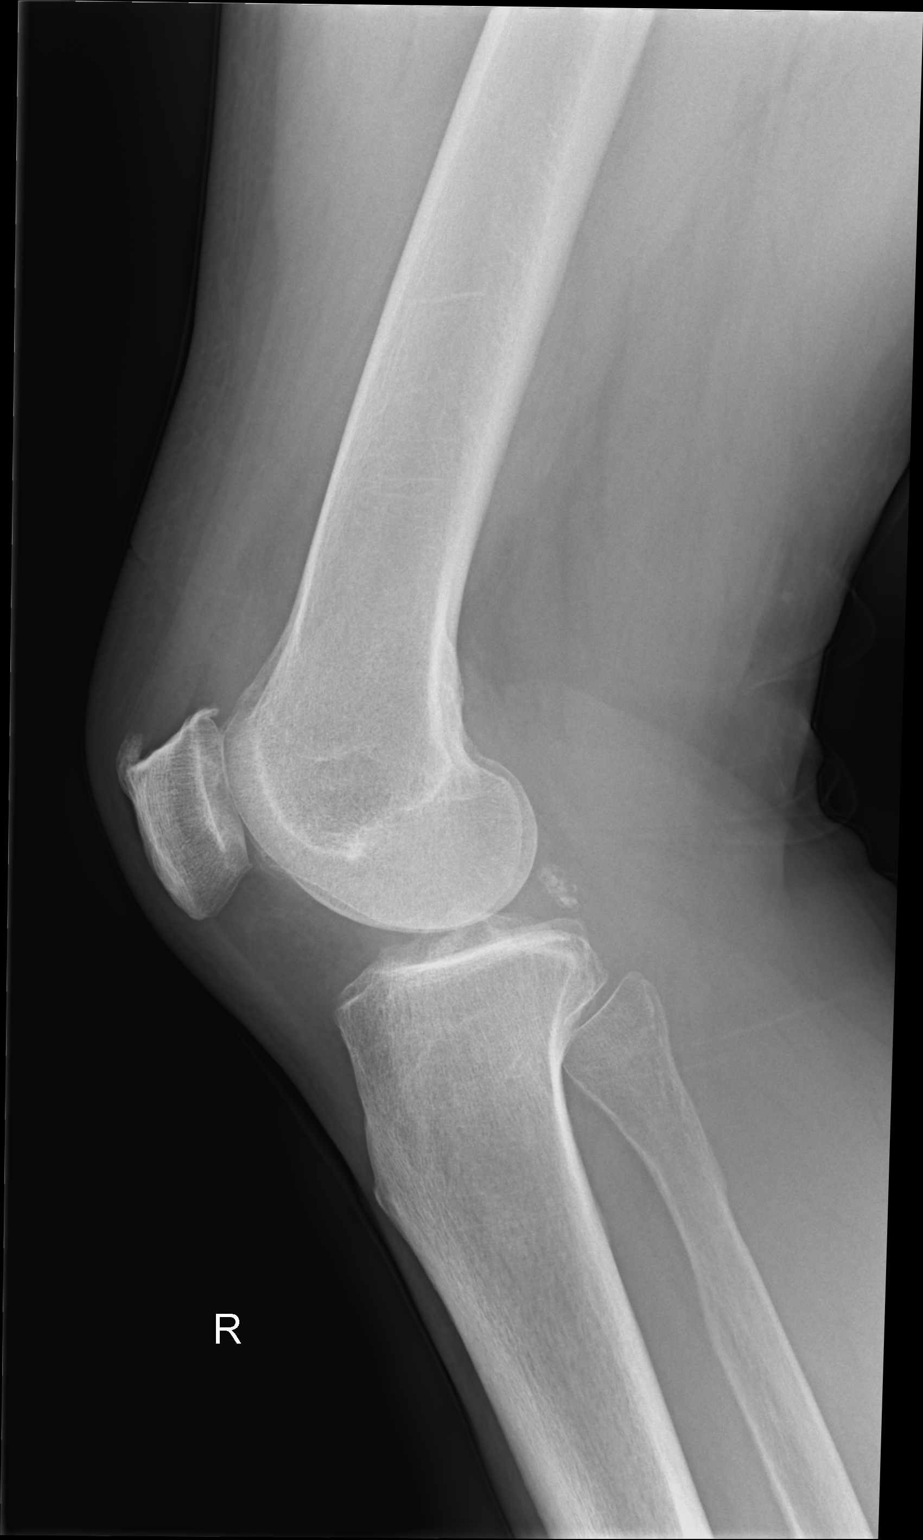

[3 of 3 positions shown; findings below may reference images not displayed]

DIAGNOSTIC STUDIES

EXAM

XR knee RT 3V

INDICATION

Knee pain
Pt c/o right knee pain. Pt states quacking noise. CF/TB/TM

TECHNIQUE

AP lateral patellar views right knee

COMPARISONS

December 17, 2018

FINDINGS

Mild spurring of the tibial spines as well as the medial lateral compartments is evident. There is
spurring of the patellofemoral joint space with small knee effusion.

IMPRESSION

Mild tricompartment degenerative changes as described with small knee effusion. No fractures are
seen.

Tech Notes:

Pt c/o right knee pain. Pt states quacking noise. CF/TB/TM

## 2020-05-14 IMAGING — US BREASTRT
1 series · 14 of 25 positions shown · non-contrast
Comparison: none

[Series 1: us breast right complete · 14 of 31 slices shown]
[im 1/31]
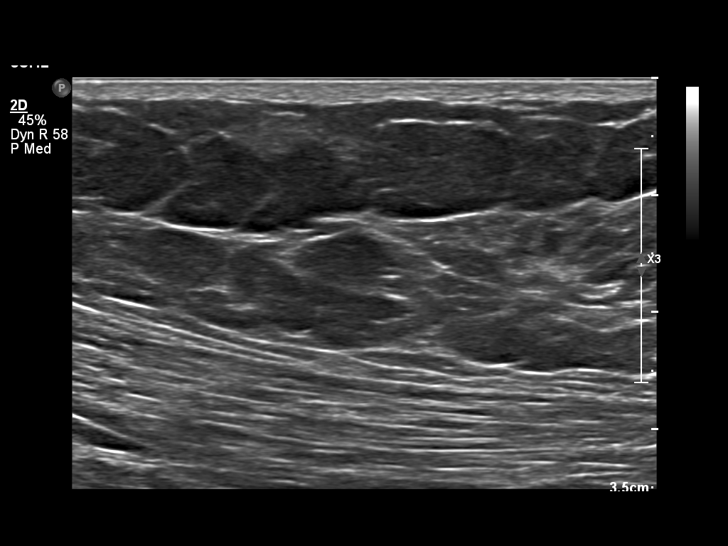
[im 3/31]
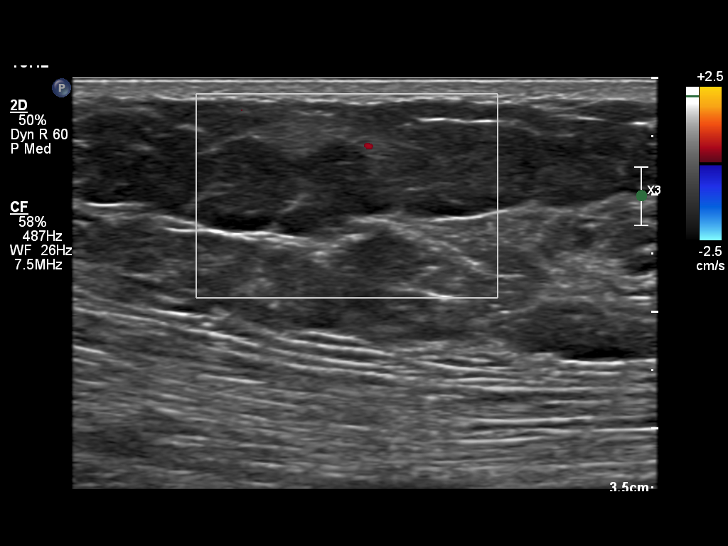
[im 6/31]
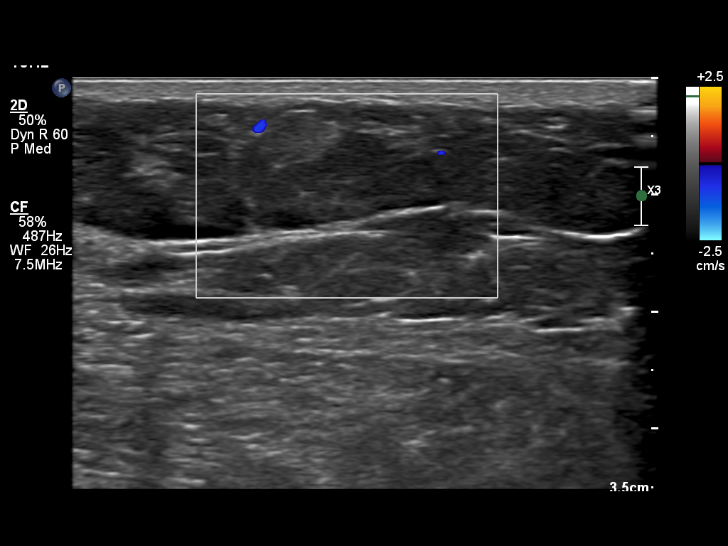
[im 8/31]
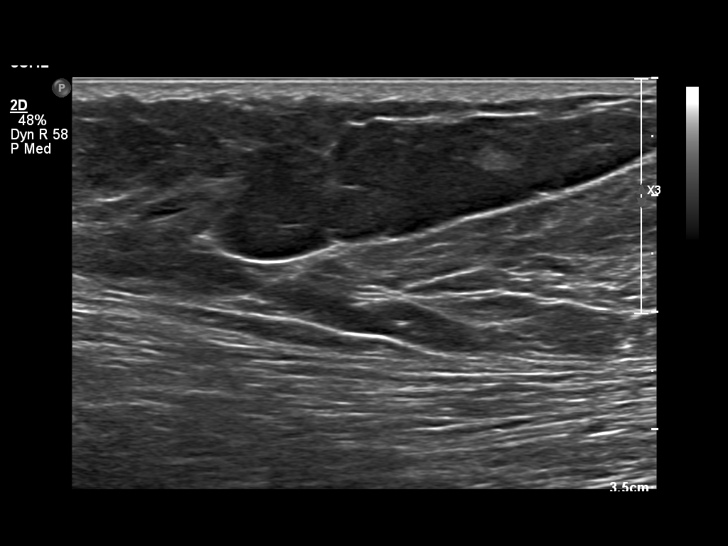
[im 11/31]
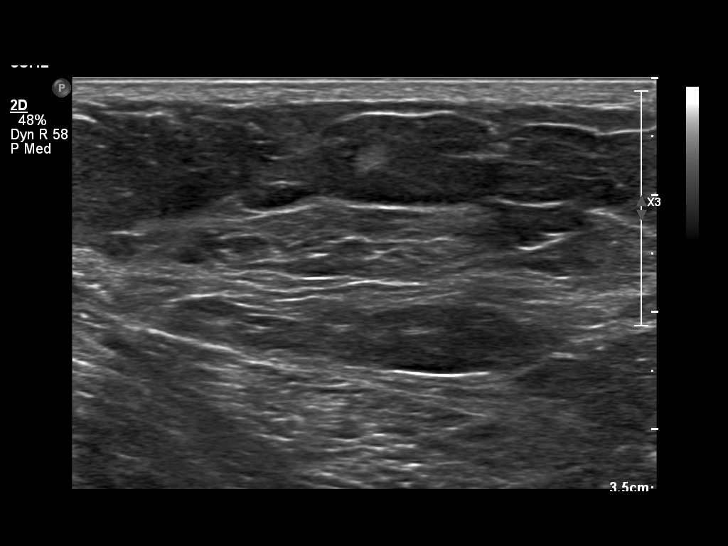
[im 12/31]
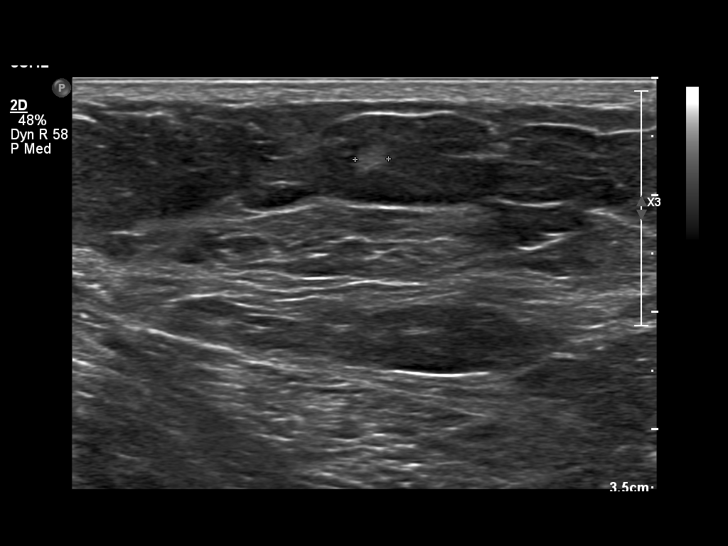
[im 14/31]
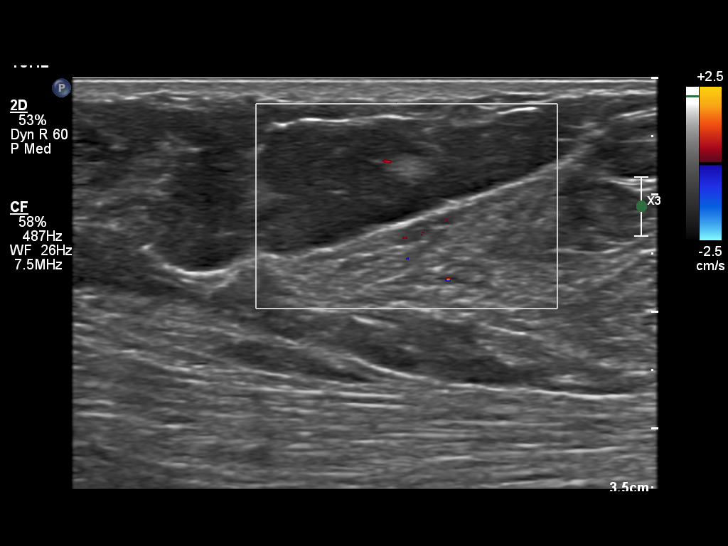
[im 17/31]
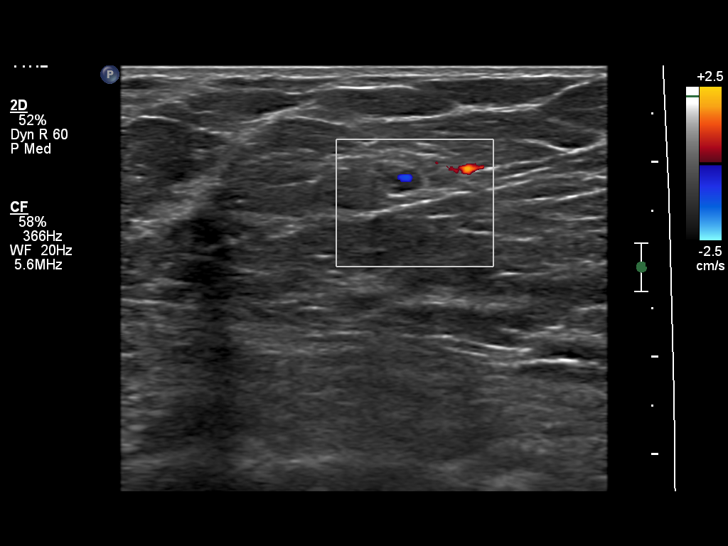
[im 19/31]
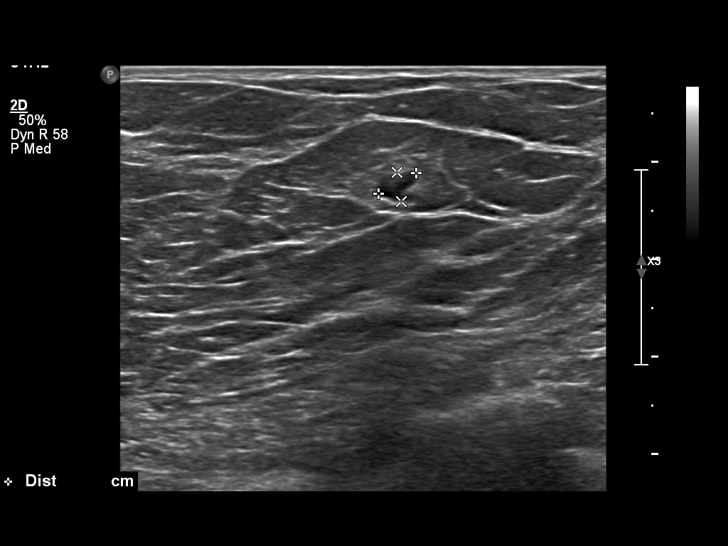
[im 21/31]
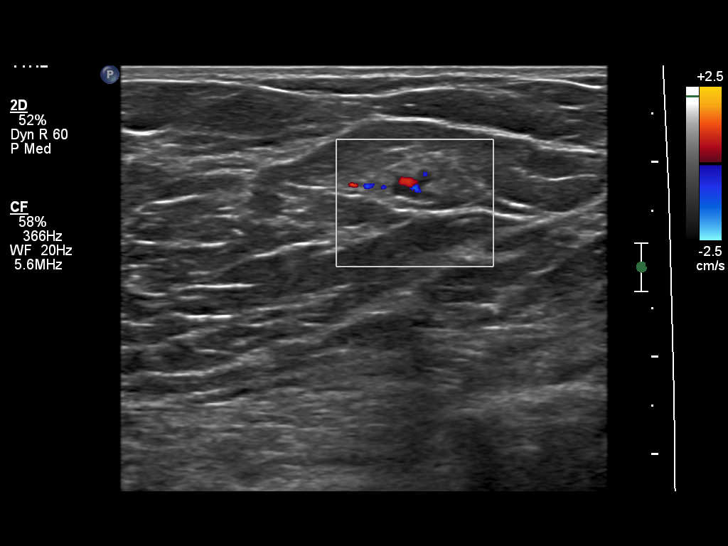
[im 23/31]
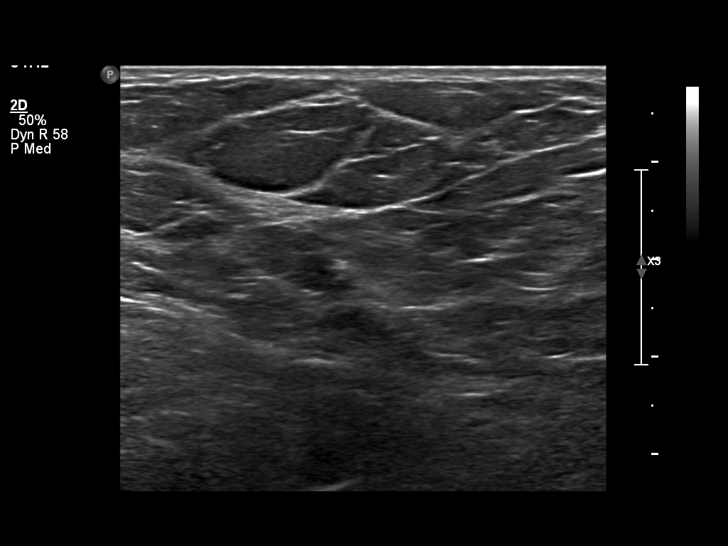
[im 26/31]
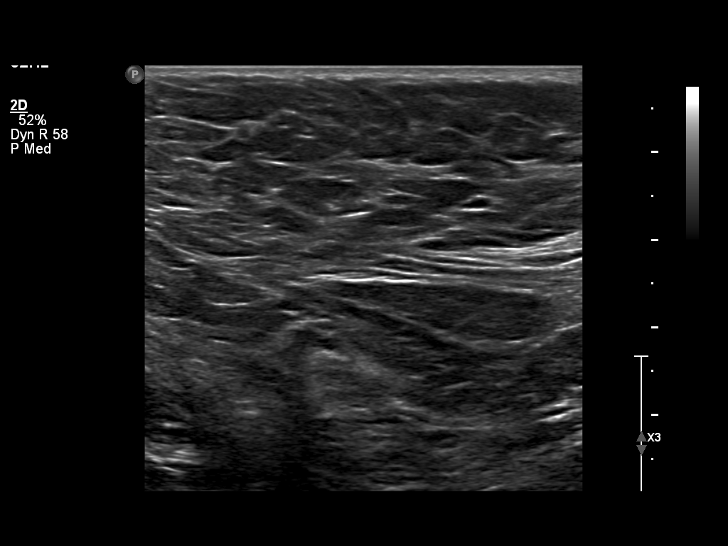
[im 28/31]
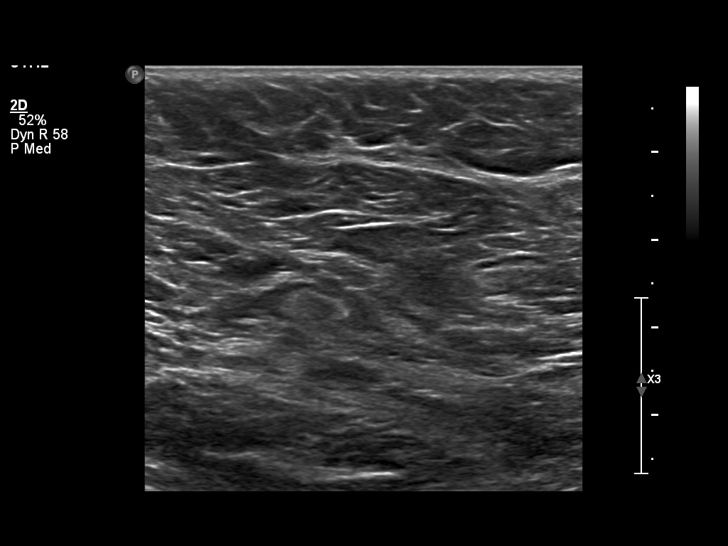
[im 31/31]
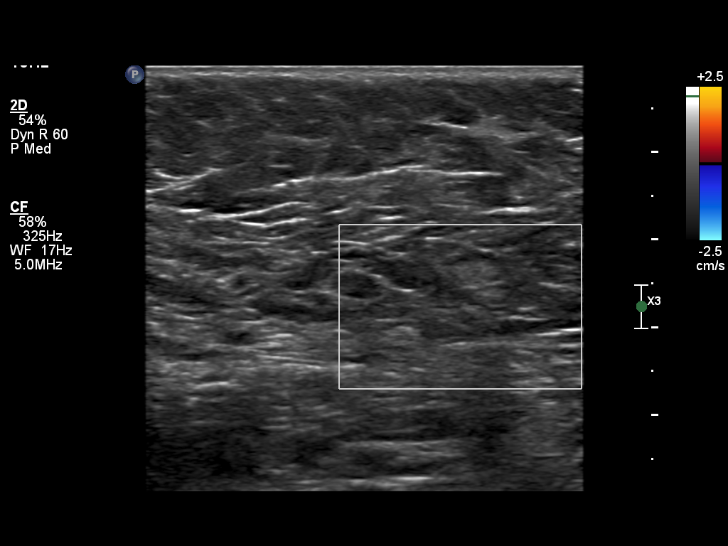

[14 of 25 positions shown; findings below may reference images not displayed]

DIAGNOSTIC STUDIES

US BREAST

EXAM

INDICATION

abnormal mammo
TB

TECHNIQUE

Grayscale and Color Doppler imaging evaluation in the region of concern was performed

COMPARISONS

May 07, 2020

FINDINGS

At the 9 o'clock position 8 cm from the nipple there is a 5 x 4 x 3 millimeter intramammary node
with a fatty hilum, corresponding to findings on the prior mammogram. A few small typically benign
hyperechoic foci are noted in the right breast. No suspicious right axillary nodes.

IMPRESSION

Findings on prior mammogram correspond to an intramammary node.

BI - RADS 2. Benign findings. Resume annual screening mammography.

Tech Notes:

TB

## 2020-08-17 ENCOUNTER — Encounter: Admit: 2020-08-17 | Discharge: 2020-08-17 | Payer: MEDICARE

## 2020-08-24 ENCOUNTER — Encounter: Admit: 2020-08-24 | Discharge: 2020-08-24 | Payer: MEDICARE

## 2020-11-30 ENCOUNTER — Encounter: Admit: 2020-11-30 | Discharge: 2020-11-30 | Payer: MEDICARE

## 2020-11-30 NOTE — Telephone Encounter
Molly [name] stating she has a "problem with the feeling in my feet. Dr. Roger Kill knows me, please ask her what I should do?"    [name] notifying Molly [name] received and requested she call back to discuss further.    OV note from Molly [name]'s 02/17/2020 visit with Dr. Roger Kill includes history of MS, anxiety, Hepatitis B infection, Psoriasis. Psoriatic plaques located primarily on both feet affecting her left foot more than her right foot.

## 2020-12-04 ENCOUNTER — Encounter: Admit: 2020-12-04 | Discharge: 2020-12-04 | Payer: MEDICARE

## 2020-12-14 ENCOUNTER — Encounter: Admit: 2020-12-14 | Discharge: 2020-12-14 | Payer: MEDICARE

## 2020-12-14 NOTE — Telephone Encounter
Molly [name] requesting Dr. Roger Kill to send some valium into pharmacy in preparation for her MRI scheduled Wednesday. Molly [name] states she is very claustrophobic and does not believe she will get through the MRI without Valium.

## 2020-12-21 ENCOUNTER — Encounter: Admit: 2020-12-21 | Discharge: 2020-12-21 | Payer: MEDICARE

## 2020-12-21 DIAGNOSIS — G43909 Migraine, unspecified, not intractable, without status migrainosus: Secondary | ICD-10-CM

## 2020-12-21 DIAGNOSIS — Z79899 Other long term (current) drug therapy: Secondary | ICD-10-CM

## 2020-12-21 MED ORDER — UBRELVY 100 MG PO TAB
ORAL_TABLET | Freq: Every day | ORAL | 0 refills | 30.00000 days | Status: AC
Start: 2020-12-21 — End: ?

## 2020-12-21 MED ORDER — UBRELVY 100 MG PO TAB
ORAL_TABLET | Freq: Every day | 0 refills
Start: 2020-12-21 — End: ?

## 2020-12-21 NOTE — Telephone Encounter
Molly [name] stating she has a major migraine currently from the bone spur in her neck or head. She has tried everything. She is having blurred vision as well.     [name] recommending Molly [name] go to the ER for acute migraine pain relief. I also stated I have alerted Dr. Roger Kill and will call back if there are additional recommendations. Direct clinic call back left.

## 2020-12-21 NOTE — Telephone Encounter
Upon my return call Molly [name] states, "I have tried some gummies with THC, tizanidine, clonazepam & gabapentin. My Primary tried to give me baclofen, which did nothing except keep me up all night. I tried tramadol today, didn't help. My migraine pain level is like a 50."      Molly [name] states she has an aura, the left side of her neck is very soar as well. She has iced it and used biofreeze. Molly [name] confirmed this migraine has lasted about a week so far.     Notified Molly [name] these details will be discussed with Dr. Roger Kill and recommendations will be given soon.      Discussed the above with Dr. Roger Kill via phone; she recommends Ubrelvy or nurtec and the following labs CBC w/diff, CMP, UA.    Notified Molly [name] of the above recommendations, she requested to try Molly [name] and is willing to have the labs done close to home. Molly [name] confirmed she has not completed the MRI's at this point. I urged Molly [name] to go ahead and complete these along with COVID testing as migraine is one of the symptoms.     Molly [name] script sent to pharmacy, Dr. Roger Kill to [place]. Molly [name] notified.

## 2021-01-01 IMAGING — MR Head^Brain
8 series · 40 of 48 positions shown · non-contrast
Comparison: none

[Series 2: T1 · sagittal · 5.0mm · 0.45mm/px · 5 of 20 slices shown (1 of 2)]
[im 1/20]
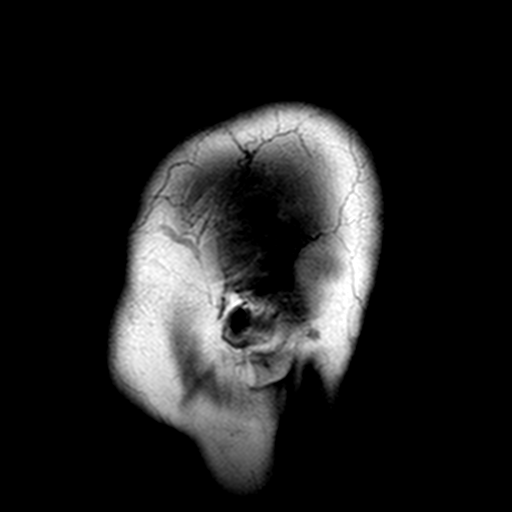
[im 5/20]
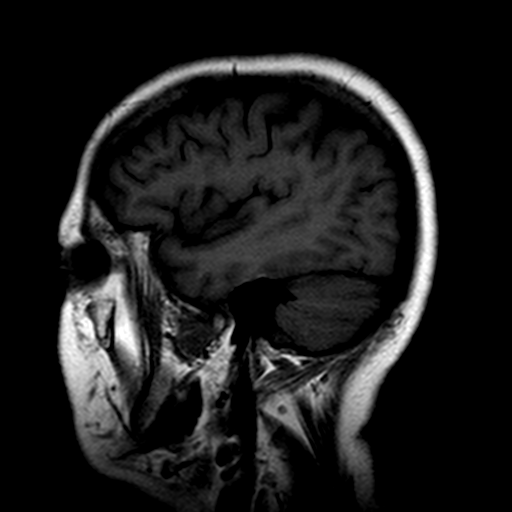
[im 10/20]
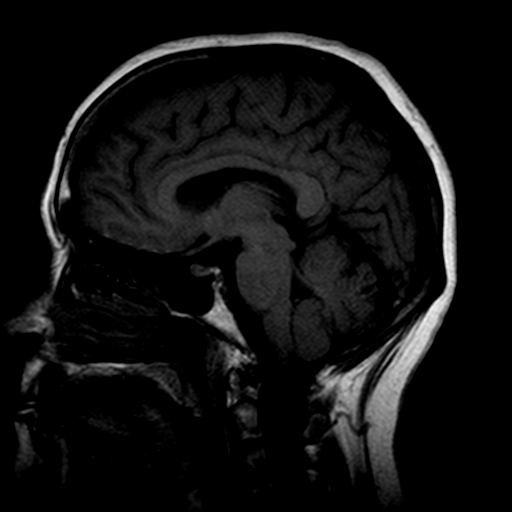
[im 15/20]
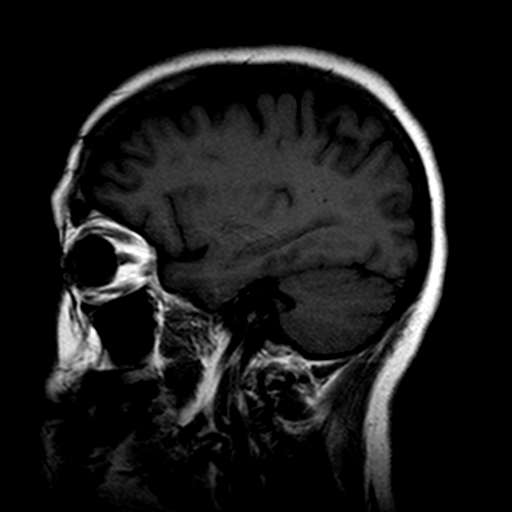
[im 20/20]
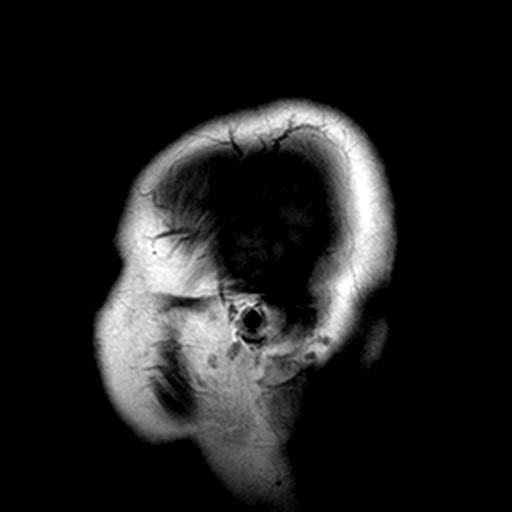

[Series 3: DWI · axial · 5.0mm · 1.80mm/px · z∈[-73,+56]mm · 9 of 62 slices shown (1 of 2)]
[im 1/62]
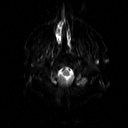
[im 11/62]
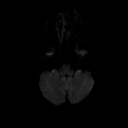
[im 21/62]
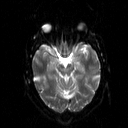
[im 26/62]
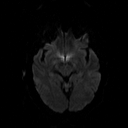
[im 31/62]
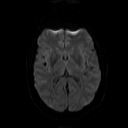
[im 36/62]
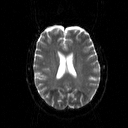
[im 41/62]
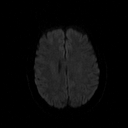
[im 51/62]
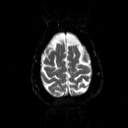
[im 62/62]
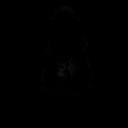

[Series 4: DWI · axial · 5.0mm · 1.80mm/px · z∈[-73,+56]mm · 4 of 21 slices shown (2 of 2)]
[im 1/21]
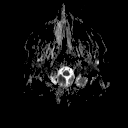
[im 7/21]
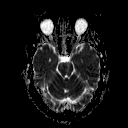
[im 14/21]
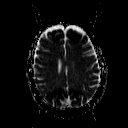
[im 21/21]
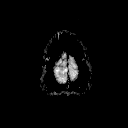

[Series 5: FLAIR · axial · 5.0mm · 0.45mm/px · z∈[-89,+60]mm · 5 of 24 slices shown]
[im 1/24]
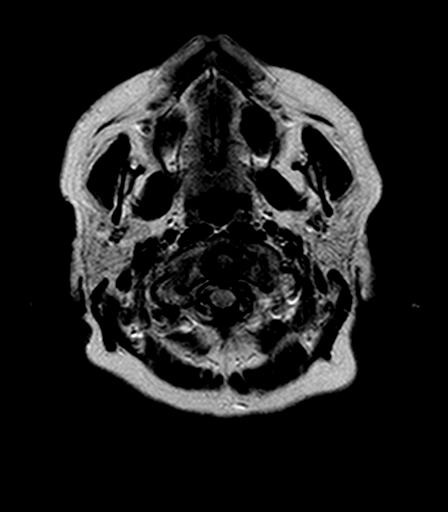
[im 6/24]
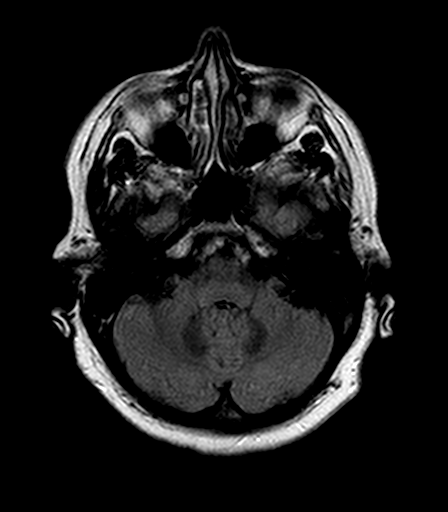
[im 12/24]
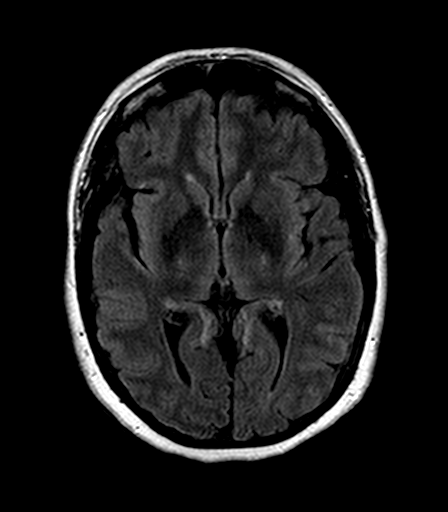
[im 18/24]
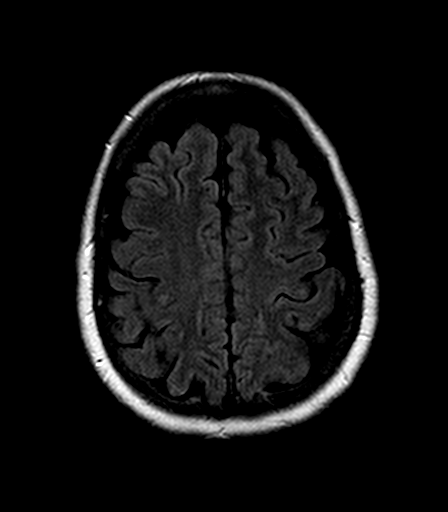
[im 24/24]
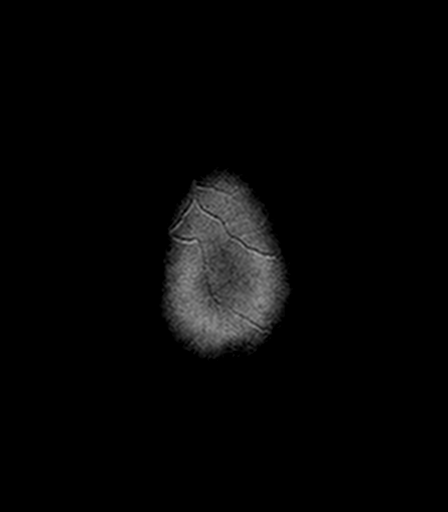

[Series 6: T2 · axial · 5.0mm · 0.72mm/px · z∈[-89,+60]mm · 5 of 24 slices shown (1 of 2)]
[im 1/24]
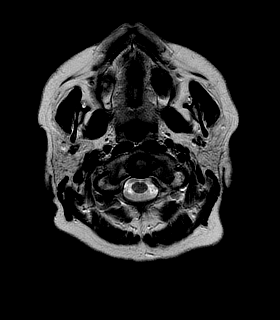
[im 6/24]
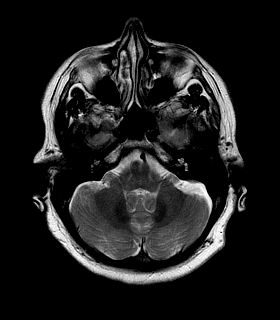
[im 12/24]
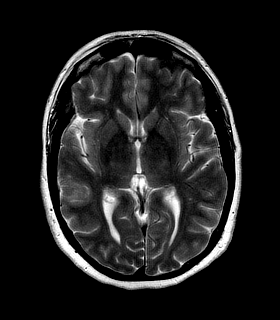
[im 18/24]
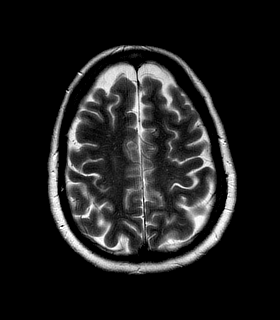
[im 24/24]
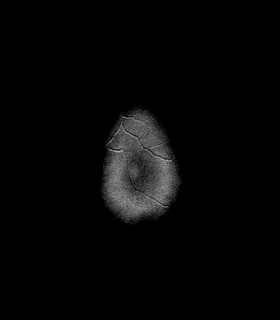

[Series 7: T1 · axial · 5.0mm · 0.45mm/px · z∈[-89,+60]mm · 5 of 24 slices shown (2 of 2)]
[im 1/24]
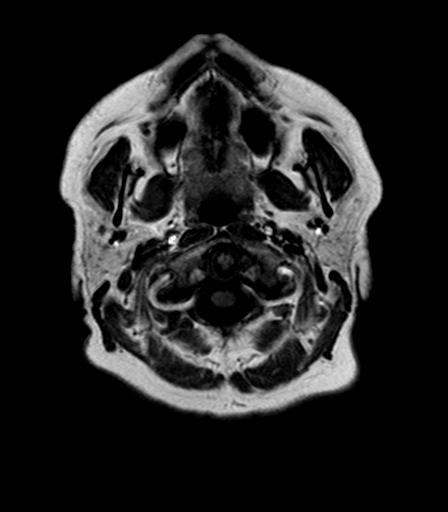
[im 6/24]
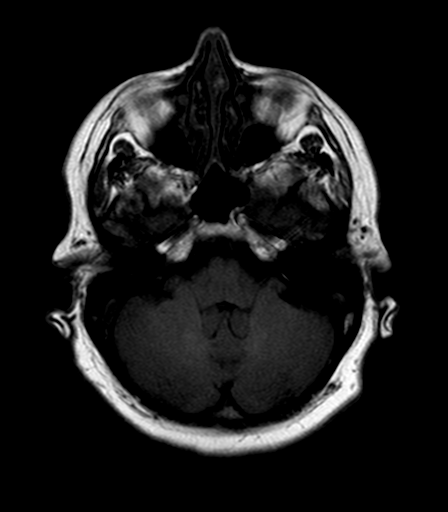
[im 12/24]
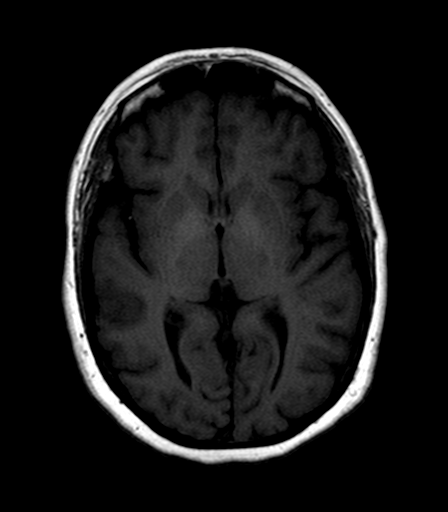
[im 18/24]
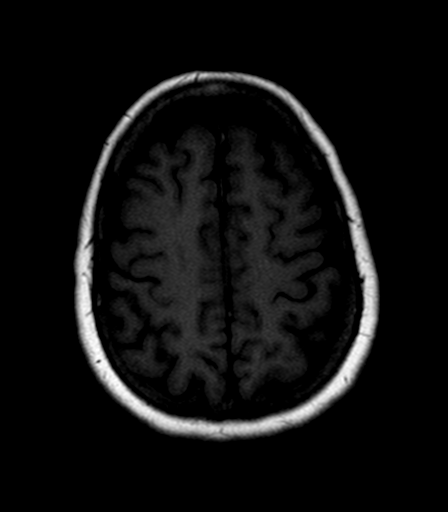
[im 24/24]
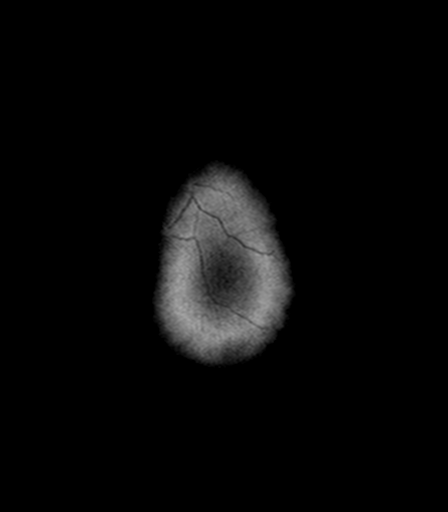

[Series 8: axial blood · axial · 5.0mm · 0.45mm/px · 1 of 24 slices shown]
[im 1/24]
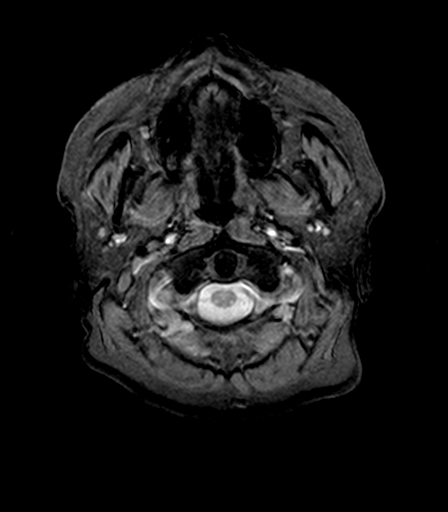

[Series 9: T2 · coronal · 5.0mm · 0.69mm/px · 6 of 26 slices shown (2 of 2)]
[im 1/26]
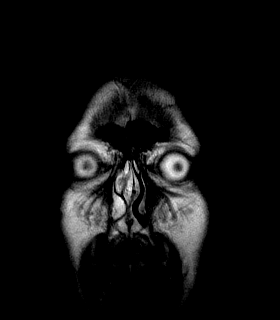
[im 6/26]
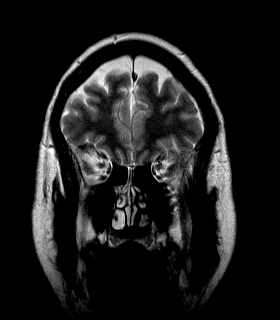
[im 11/26]
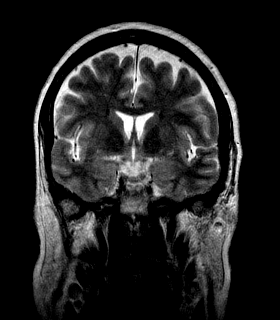
[im 16/26]
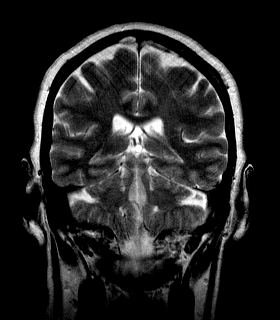
[im 21/26]
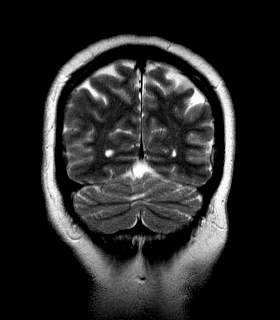
[im 26/26]
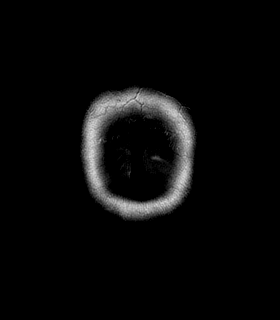

[40 of 48 positions shown; findings below may reference images not displayed]

EXAM

MR head/brain wo con

INDICATION

Altered mental status

TECHNIQUE

Multiplanar, multisequence imaging of the brain was performed without contrast.

COMPARISONS

None available at the time of dictation.

FINDINGS

Parenchyma: No diffusion restriction, acute hemorrhage, midline shift, or herniation. Mild
scattered nonspecific T2/FLAIR hyperintensities within the periventricular and centrum semiovale
white matter, which most likely represents chronic microvascular ischemic change.

Ventricles: No hydrocephalus.

Extra-axial spaces: No extra-axial collection.

Flow voids: Intact.

Other: Bony calvarium is intact. The visualized paranasal sinuses and mastoid air cells are
essentially clear.

IMPRESSION
1. No MR evidence of an acute intracranial abnormality.

Tech Notes:

CONTINUOUS HEADACHE X 3 WEEKS, NUMBNESS TO FOOT.  ALL ACUTE.  HX MS DIAGNOSIS RG

## 2021-01-01 IMAGING — MR T-spine^Routine
7 series · 40 of 48 positions shown · non-contrast
Comparison: none

[Series 5: T2 · sagittal · 3.0mm · 0.62mm/px · 6 of 15 slices shown (1 of 3)]
[im 1/15]
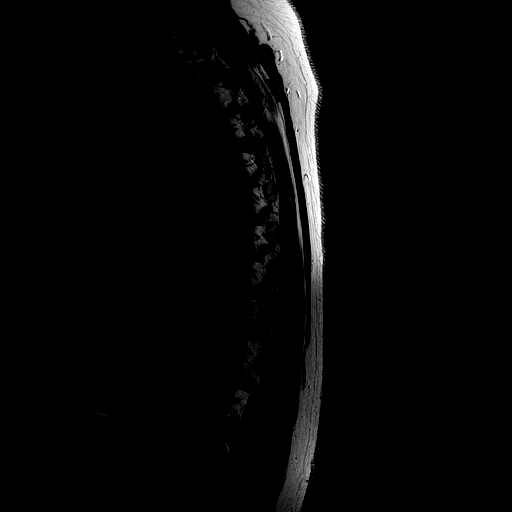
[im 3/15]
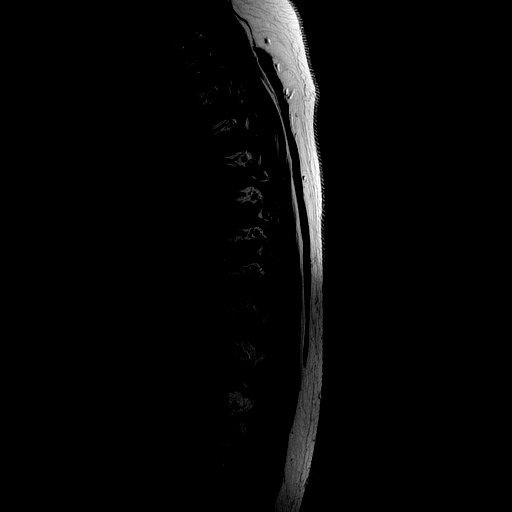
[im 6/15]
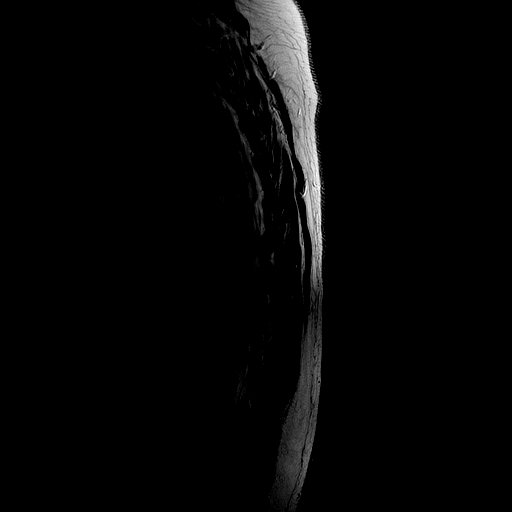
[im 9/15]
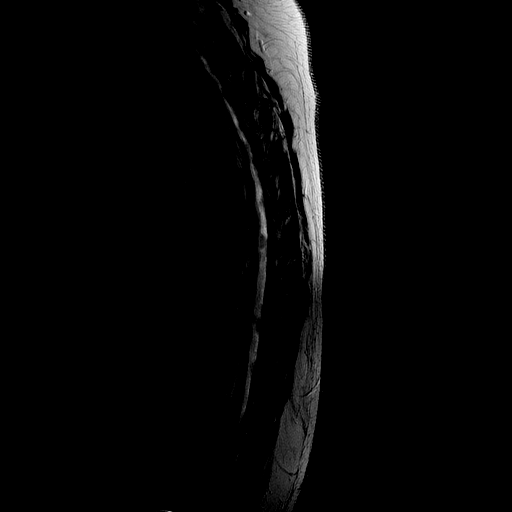
[im 12/15]
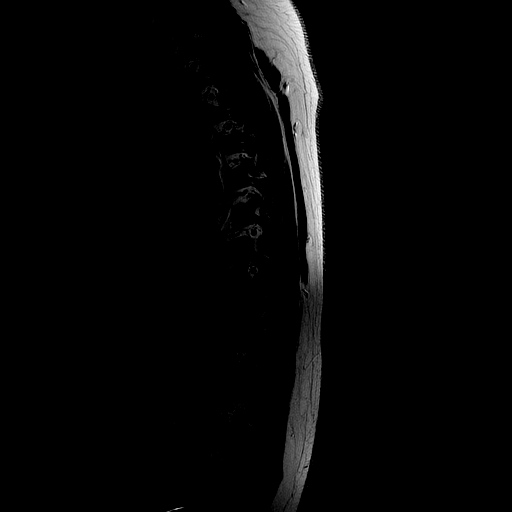
[im 15/15]
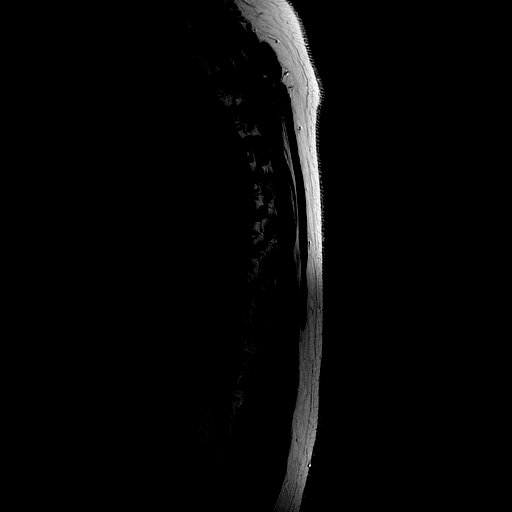

[Series 7: T1 · sagittal · 3.0mm · 0.71mm/px · 6 of 15 slices shown (1 of 3)]
[im 1/15]
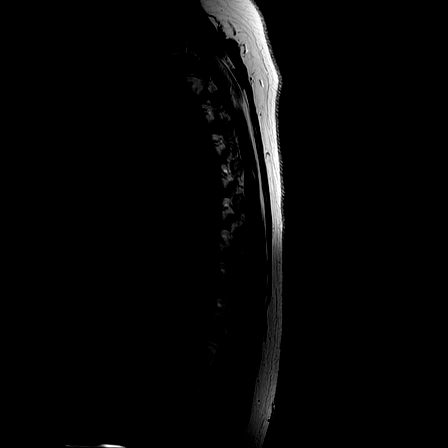
[im 3/15]
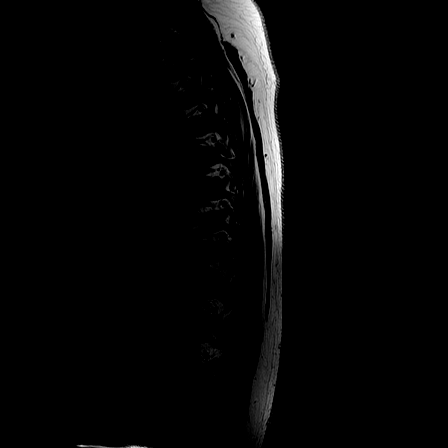
[im 6/15]
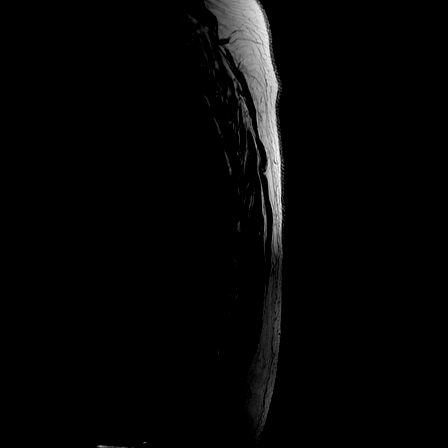
[im 9/15]
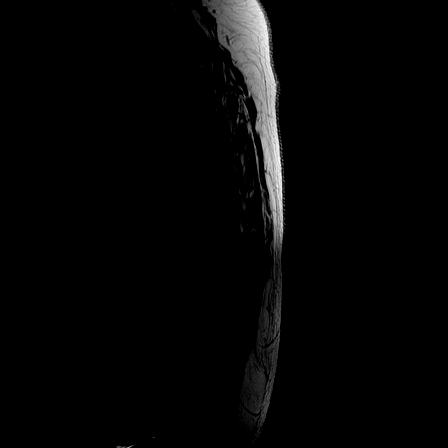
[im 12/15]
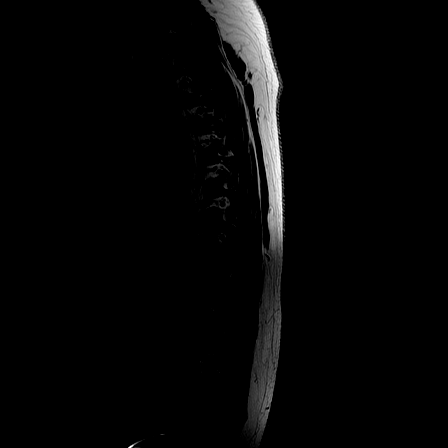
[im 15/15]
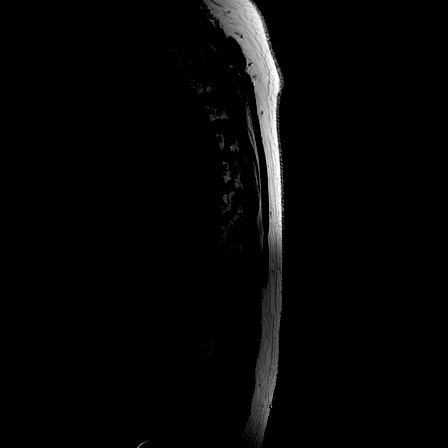

[Series 8: T2 · axial · 5.0mm · 0.62mm/px · z∈[-111,+89]mm · 8 of 28 slices shown (2 of 3)]
[im 1/28]
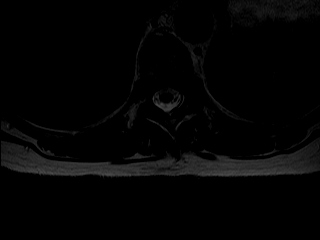
[im 4/28]
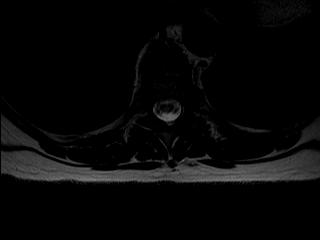
[im 10/28]
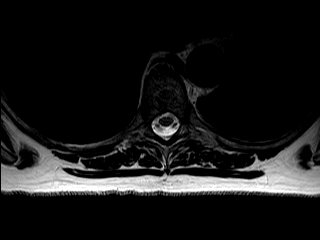
[im 13/28]
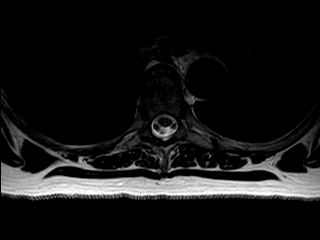
[im 16/28]
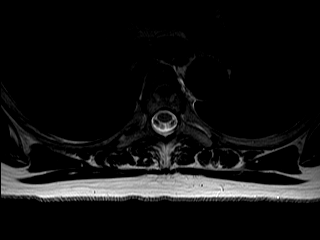
[im 19/28]
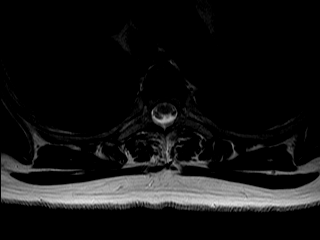
[im 25/28]
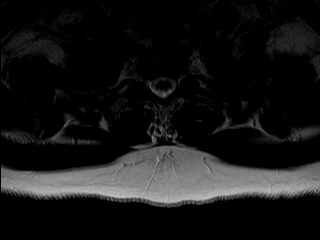
[im 28/28]
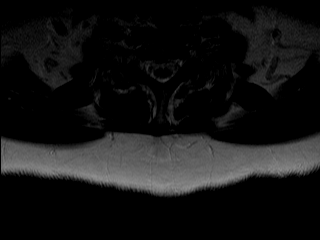

[Series 9: STIR · sagittal · 3.0mm · 0.62mm/px · 1 of 15 slices shown]
[im 1/15]
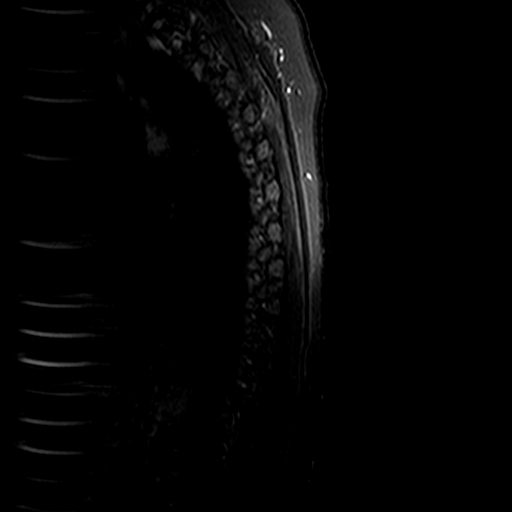

[Series 10: T2 · axial · 5.0mm · 0.62mm/px · z∈[-187,+15]mm · 8 of 28 slices shown (3 of 3)]
[im 1/28]
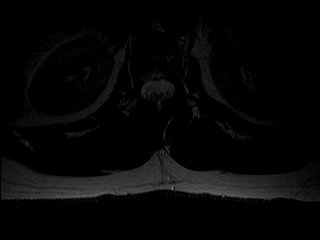
[im 4/28]
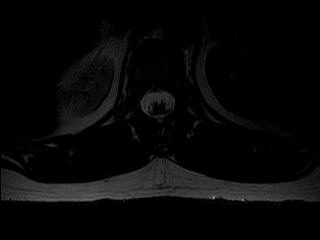
[im 10/28]
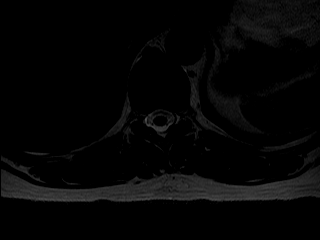
[im 13/28]
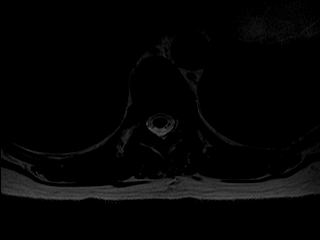
[im 16/28]
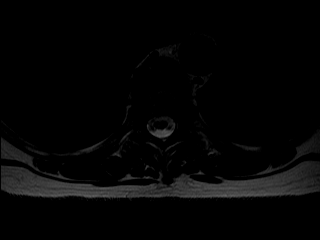
[im 19/28]
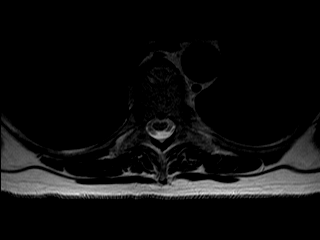
[im 25/28]
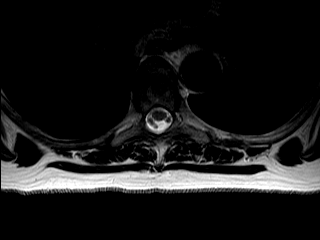
[im 28/28]
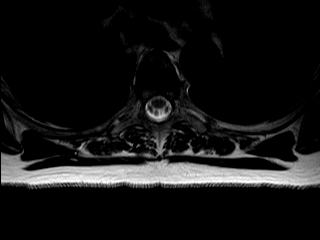

[Series 11: T1 · axial · 5.0mm · 0.78mm/px · z∈[-68,+72]mm · 6 of 17 slices shown (2 of 3)]
[im 1/17]
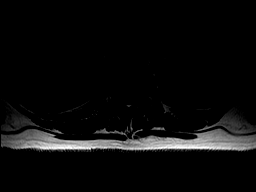
[im 4/17]
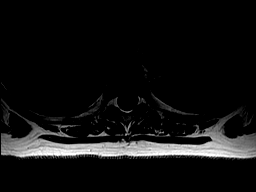
[im 7/17]
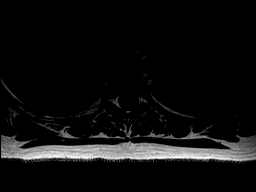
[im 10/17]
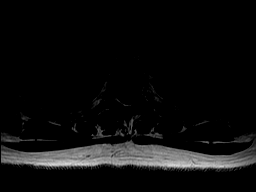
[im 13/17]
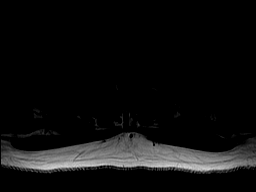
[im 17/17]
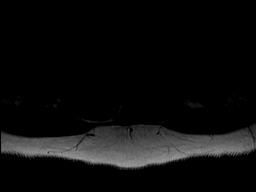

[Series 12: T1 · axial · 5.0mm · 0.78mm/px · z∈[-183,-27]mm · 5 of 13 slices shown (3 of 3)]
[im 1/13]
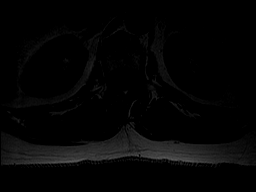
[im 4/13]
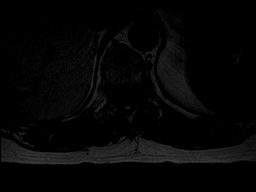
[im 7/13]
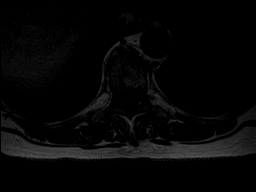
[im 10/13]
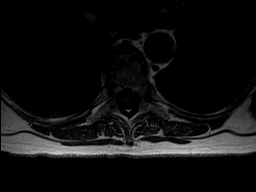
[im 13/13]
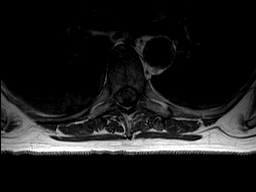

[40 of 48 positions shown; findings below may reference images not displayed]

DIAGNOSTIC STUDIES

EXAM

MR thoracic spine wo con

INDICATION

CONTINUOUS HEADACHE X 3 WEEKS, NUMBNESS TO FOOT.  ALL ACUTE.  HX MS DIAGNOSIS RG

TECHNIQUE

MR thoracic spine wo con

COMPARISONS

None

FINDINGS

No acute fracture or dislocation. Normal thoracic kyphosis is seen. Normal signal attenuation
associated with the spinal cord. Vertebral body heights appear maintained. Scattered interosseous
hemangiomas are seen.

Disc bulges are visualized at the level of T6-T7, T7-T8, and T8-T9. These result in no significant
neural foraminal or spinal canal stenosis.

IMPRESSION

No acute osseous findings.

The thoracic cord is within normal limits.

Degenerative disc changes without significant neural foraminal or spinal canal stenosis.

Tech Notes:

CONTINUOUS HEADACHE X 3 WEEKS, NUMBNESS TO FOOT.  ALL ACUTE.  HX MS DIAGNOSIS RG

## 2021-01-07 ENCOUNTER — Encounter: Admit: 2021-01-07 | Discharge: 2021-01-07 | Payer: MEDICARE

## 2021-01-08 ENCOUNTER — Encounter: Admit: 2021-01-08 | Discharge: 2021-01-08 | Payer: MEDICARE

## 2021-01-08 DIAGNOSIS — G35 Multiple sclerosis: Secondary | ICD-10-CM

## 2021-01-18 ENCOUNTER — Encounter: Admit: 2021-01-18 | Discharge: 2021-01-18 | Payer: MEDICARE

## 2021-01-18 NOTE — Progress Notes
Requested images be clouded to [place] from [facility]. Email request sent to [facility], copied below.      Kindly transfer the images for this patient from the cloud to [place] when you are able.    Molly [name]  DOB: 02/16/1961  MR# [id number]  Images: MRI Head, MRI Thoracic Spine  DOS: 01/01/2021  Location: [facility]    Thanks!  Susa Day, BSN, RN  Dedicated Support for  Luz Brazen, DO, MBA, FAAN, [employer], Neurosciences   [employer] [place] [employer], Neurology  Phone: [phone] - Fax: [phone]     (She/ Her)                                     STATEMENT OF CONFIDENTIALITY:  The information contained in this email message is privileged and confidential information intended only for the use of the individual or entity named above. If the reader of this message IS NOT the intended recipient, you are hereby notified that any dissemination distribution, or copying of this communication is strictly prohibited. If you have received this communication in error, please immediately notify me and permanently delete this email thread. Thank you.

## 2021-01-18 NOTE — Telephone Encounter
Molly [name] called requesting Dr. Roger Kill to review her MRI films from this recent round of images completed 9/9. She states the reports do not mention anything about the bone spur. She would like to know if its moved "or something."    Routed to Dr. Roger Kill for review.

## 2021-01-21 ENCOUNTER — Encounter: Admit: 2021-01-21 | Discharge: 2021-01-21 | Payer: MEDICARE

## 2021-01-27 ENCOUNTER — Encounter: Admit: 2021-01-27 | Discharge: 2021-01-27 | Payer: MEDICARE

## 2021-02-15 ENCOUNTER — Encounter: Admit: 2021-02-15 | Discharge: 2021-02-15 | Payer: MEDICARE

## 2021-02-15 MED ORDER — TIZANIDINE 4 MG PO TAB
8 mg | ORAL_TABLET | Freq: Three times a day (TID) | ORAL | 3 refills | Status: AC
Start: 2021-02-15 — End: ?

## 2021-02-15 NOTE — Telephone Encounter
Tizanidine script escriped to pharmacy, per LOV with Dr. Roger Kill from [facility] no medication changes recommended. Dr. Roger Kill to Co-sign.      LOV note placed in medical records to be scanned to chart. Molly [name] is scheduled to transition care to [facility] office 03/17/2021.

## 2021-03-16 NOTE — Progress Notes
Date of Service: 03/17/21    Subjective:       Chief Complaint:   Chief Complaint   Patient presents with   ? Follow Up     Multiple sclerosis         Obtained patient's verbal consent to treat them and their agreement to [facility] financial policy and NPP via this telehealth visit during the [facility] Emergency    Donnajean is a 60 year old left-handed woman who is well-known to me.  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  I last evaluated her in October 2021.  She was diagnosed with MS many years ago.  Her symptoms have largely involved severe muscle cramping, numbness and tingling, primarily in the lower extremities, but to some degree in the upper extremities.  At times she has experienced weakness in her limbs as well.  Her symptoms are much worse when exposed to extreme heat or cold.  She also has severe associated fatigue, and mild imbalance.     From January 2014 until spring 2018 she was on Tecfidera 240 mg twice daily.  She tolerated this well, but continued to have progression of her disease.  She was started on Tysabri in June 2018 and remained on this until late 2019, but developed severe generalized burning sensation in her body during and after her infusions, increasing with each infusion, so we discontinued it.  We checked her for antibody development to Tysabri, but these were negative, so it was unlikely she was actually experiencing allergic reaction.      In 2020, Liezl experienced a fairly severe exacerbation in November 2020 and was treated with Acthar gel at that time.  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  She saw Dr. Cindi Carbon, gastroenterologist, and since that time has been on Entecavir.  After discussion with Dr. Darrin Luis, we agreed that it was reasonable for her to go ahead and initiate Ocrevus.  In December 2020 she underwent her first series of Ocrevus.  Other than mild associated flushing with the infusions, she tolerated these well.  She developed a severe rash on her feet, initially thought she had shingles, so decided to stop the infusions.  As it turns out, this was actually a severe flare of her psoriasis.  She has been seeing Dr. Assunta Gambles, dermatologist as well as Dr. Laneta Simmers rheumatologist for psoriasis with associated psoriatic arthritis.  She continues to be treated with methotrexate, now off of methotrexate.     Tizanidine and gabapentin as well as lamotrigine continue to be helpful for her pain and muscle spasm.  Clonazepam is taken as needed to help her sleep as well as for severe muscle spasm unrelieved by above.     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  She remains on clopidogrel 75 mg daily.     Repeat MRIs of the brain and thoracic spine in September 2022 were completely unchanged from prior study.  Laboratory studies from July 2022 were notable for MCV elevated at 100 however the remainder of her CBC with differential was unremarkable.  CMP was unremarkable.  Total cortisol level was within range at 6.2.    Over the last 4 to 6 weeks, Halimah has had increasing pain.  She states I cannot stand anything to touch me.  We agreed for her to try increasing gabapentin from 900 mg twice daily  to 1200 mg twice daily.  This has been helpful.  Dr. Herschell Dimes has been managing her hypertension.  Halina will be having cataract surgery in both eyes, 2 weeks apart in December 2022.      Medical History:   Diagnosis Date   ? Generalized headaches    ? Hepatitis B infection    ? Multiple sclerosis (HCC)    ? Psoriasis             Social Determinants of Health     Tobacco Use: Low Risk    ? Smoking Tobacco Use: Never   ? Smokeless Tobacco Use: Never   ? Passive Exposure: Not on file   Alcohol Use: Not on file   Financial Resource Strain: Not on file   Food Insecurity: Not on file   Transportation Needs: Not on file Stress: Not on file   Social Connections: Not on file   Health Literacy: Not on file   Depression: Not on file   Housing Stability: Not on file         Family History   Problem Relation Age of Onset   ? Cancer Mother    ? Alcohol abuse Mother    ? Cancer-Pancreas Mother    ? Glaucoma Mother    ? Hypertension Father    ? Coronary Artery Disease Father    ? Diabetes Type II Father    ? Heart Attack Father    ? Migraines Father    ? Seizures Son    ? Alcohol abuse Maternal Grandmother    ? Hypertension Paternal Grandmother    ? Diabetes Type II Paternal Grandmother    ? Hypertension Paternal Grandfather    ? Heart Attack Paternal Grandfather    ? Diabetes Type II Paternal Grandfather    ? Coronary Artery Disease Paternal Grandfather    ? Kidney Disease Paternal Grandfather    ? Cancer Other    ? Cancer Maternal Aunt    ? Arthritis-rheumatoid Paternal Aunt        Review of Systems  Constitutional: Negative for chills, fever and unexpected weight change.   HENT: Negative for hearing loss, mouth sores and sore throat.    Eyes: Negative for visual disturbance.   Respiratory: Negative for choking and shortness of breath.    Cardiovascular: Negative for chest pain and palpitations.   Gastrointestinal: Negative for diarrhea, nausea and vomiting.   Genitourinary: Negative for dysuria, frequency and urgency.   Musculoskeletal: Negative for arthralgias and myalgias.   Skin: Negative for color change and rash.   Psychiatric/Behavioral: Negative for dysphoric mood and suicidal ideas. The patient is not nervous/anxious.           Objective:         Allergies   Allergen Reactions   ? Doxycycline NAUSEA AND VOMITING       ? amitriptyline (ELAVIL) 10 mg tablet Take 10 mg by mouth at bedtime daily.   ? amLODIPine (NORVASC) 5 mg tablet Take 5 mg by mouth daily.   ? calcipotriene (DOVONEX) 0.005 % crea Apply  topically to affected area twice daily.   ? clobetasoL (TEMOVATE) 0.05 % topical ointment Apply  topically to affected area twice daily.   ? clonazePAM (KLONOPIN) 1 mg tablet Take 1 mg by mouth three times daily.   ? clopiDOGrel (PLAVIX) 75 mg tablet Take 75 mg by mouth daily.   ? diphenhydrAMINE-acetaminophen (TYLENOL PM EXTRA STRENGTH) 25-500 mg tab tablet Take 500 tablets by mouth daily. Taking  1000 mg QHS   ? folic acid (FOLVITE) 1 mg tablet Take 1 mg by mouth daily.   ? gabapentin (NEURONTIN) 300 mg capsule Take 1,200 mg by mouth three times daily. taking 2100mg  max per day   ? levocetirizine (XYZAL) 5 mg tablet Take 5 mg by mouth daily.   ? methotrexate sodium 2.5 mg tablet Take 15 mg by mouth every 7 days.   ? olmesartan (BENICAR) 40 mg tablet Take 40 mg by mouth daily after dinner.   ? tiZANidine (ZANAFLEX) 4 mg tablet Take two tablets by mouth three times daily.     There were no vitals filed for this visit.  There is no height or weight on file to calculate BMI.     Physical Exam  Constitutional:       Appearance: Normal appearance.   HENT:      Head: Normocephalic and atraumatic.      Mouth/Throat:      Mouth: Mucous membranes are moist.   Eyes:      Extraocular Movements: Extraocular movements intact.      Pupils: Pupils are equal, round, and reactive to light.   Cardiovascular:      Rate and Rhythm: Normal rate and regular rhythm.   Pulmonary:      Effort: Pulmonary effort is normal. No respiratory distress.   Musculoskeletal:         General: No deformity. Normal range of motion.      Cervical back: Normal range of motion and neck supple.   Skin:     General: Skin is warm and dry.   Neurological:      Mental Status: She is alert.      Comments: Alert and oriented x4.  Recent and remote memory are intact.  Speech is normal.  Attention is excellent.  Cranial nerve examination reveals no facial asymmetry or decrease in facial sensation.  Eye findings as discussed above.  Tongue and palate movements are normal.  Neck and shoulder strength is good.  Motor examination is without drift.  Strength is excellent at 5/5 throughout both upper as well as lower extremities proximally distally.  Reflexes are symmetric at 1/4 throughout with flexor plantar responses bilaterally.  Sensory examination reveals intact light touch and pinprick sensation throughout.  Coordination is excellent without evidence for dysmetria or ataxia in either of the upper lower extremities.  The patient was able to stand and ambulate without difficulty.   Psychiatric:         Mood and Affect: Mood normal.         Behavior: Behavior normal.      Office Procedure:           Assessment and Plan:    1. Multiple sclerosis (HCC)    2. Psoriasis    3. 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 - progressive, but relatively stable on methotrexate (also used for psoriasis).  Continue medications as above.  Increased pain improving with gabapentin.    Psoriasis - severe, continue methotrexate.    Chronic Hep. B infection - stable, continuing Entecavir.      I will see Jasiri again for follow-up in 6 months from now.

## 2021-03-17 ENCOUNTER — Encounter: Admit: 2021-03-17 | Discharge: 2021-03-17 | Payer: MEDICARE

## 2021-03-17 ENCOUNTER — Ambulatory Visit: Admit: 2021-03-17 | Discharge: 2021-03-18 | Payer: MEDICARE

## 2021-03-17 DIAGNOSIS — L409 Psoriasis, unspecified: Secondary | ICD-10-CM

## 2021-03-17 DIAGNOSIS — B181 Chronic viral hepatitis B without delta-agent: Secondary | ICD-10-CM

## 2021-03-17 DIAGNOSIS — B1910 Unspecified viral hepatitis B without hepatic coma: Secondary | ICD-10-CM

## 2021-03-17 DIAGNOSIS — R519 Generalized headaches: Secondary | ICD-10-CM

## 2021-03-17 DIAGNOSIS — G35 Multiple sclerosis: Secondary | ICD-10-CM

## 2021-03-23 ENCOUNTER — Encounter: Admit: 2021-03-23 | Discharge: 2021-03-23 | Payer: MEDICARE

## 2021-03-23 NOTE — Telephone Encounter
Molly [name] called wanting to ask Dr. Roger Kill how to tell the difference between a flare of MS and psoriatic arthritis for her? Her B/P has been elevated, she knows that's more to do with her psoriatic arthritis. She needs a little more guidance on how to tell one type of flare from the other.     Notified Lekeya I will call her back with guidance once I have discussed with Dr. Roger Kill.

## 2021-04-12 ENCOUNTER — Encounter: Admit: 2021-04-12 | Discharge: 2021-04-12 | Payer: MEDICARE

## 2021-04-12 NOTE — Telephone Encounter
Molly [name] stating she believes she is having an "MS Hug." She does not think she has ever experienced one of these before. She states it is very painful. Molly [name] requested a call back to discuss this further.    Upon my return call, Molly [name] states, "I have severe charlie horses in back all the way around to my ribs underneath my boobs. It started Saturday afternoon. The medicine helps to calm it down. Its very painful. Super bad spasticity. Is there anything Dr. Roger Kill can do for this?"

## 2021-04-21 ENCOUNTER — Encounter: Admit: 2021-04-21 | Discharge: 2021-04-21 | Payer: MEDICARE

## 2021-04-21 MED ORDER — CLONAZEPAM 1 MG PO TAB
1 mg | ORAL_TABLET | Freq: Three times a day (TID) | ORAL | 3 refills | Status: AC
Start: 2021-04-21 — End: ?

## 2021-04-21 NOTE — Telephone Encounter
Recieved Clonazepam refill request from [place]. Medication included in POC from [facility] with Dr. Roger Kill on 03/17/2021. Medication called in to pharmacy.

## 2021-04-21 NOTE — Telephone Encounter
Molly [name] requesting clonazepam refill be called to pharmacy.    Notified [name] request complete. Nothing further needed at this time.

## 2021-05-26 IMAGING — CT LOW_DOSE_LUNG(Adult)
2 of 9 series · 11 of 36 positions shown, 14 images · non-contrast
Comparison: none

[Series 10: lung cor 1.00 br44 s3 · coronal · 0.65mm/px · 1 of 305 slices shown]
[im 153/305  lung]
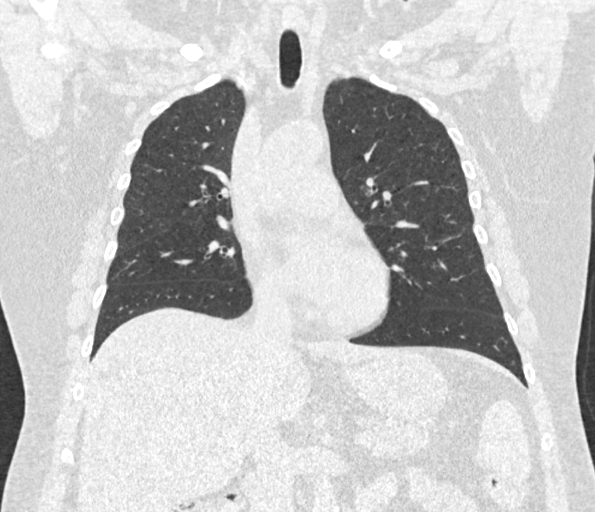

[Series 17: lung 1.00 br60 s3 cad · axial · 0.76mm/px · z∈[+1669,+1941]mm · 10 of 476 slices shown, 13 images]
[im 44/476  mediastinal]
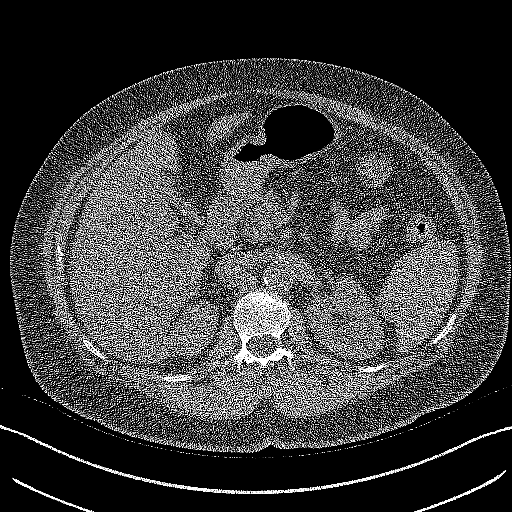
[im 44/476  lung]
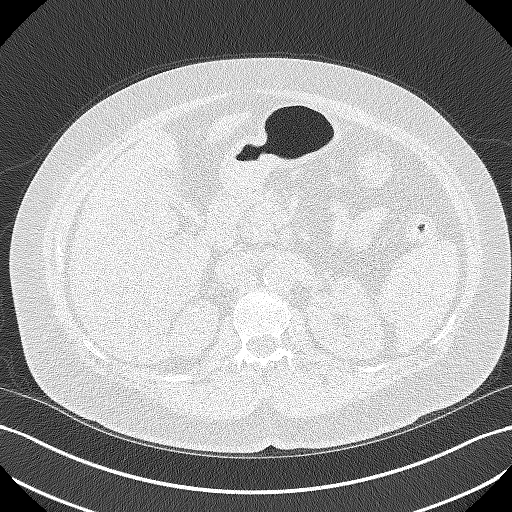
[im 87/476  lung]
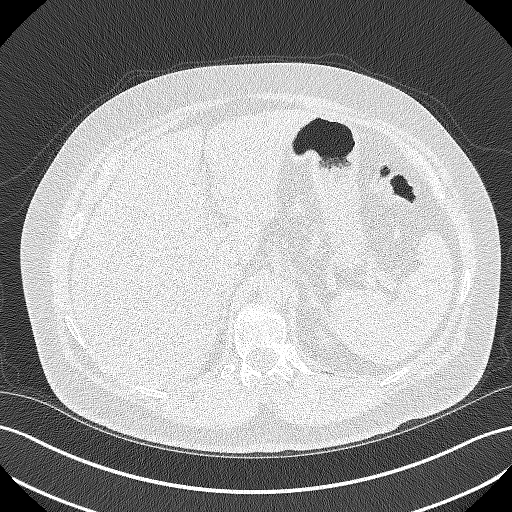
[im 130/476  lung]
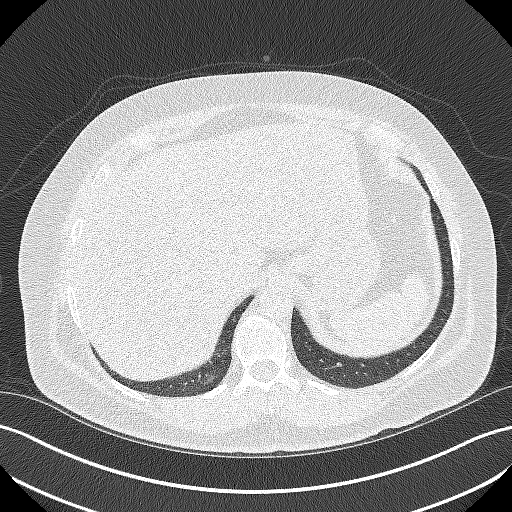
[im 173/476  lung]
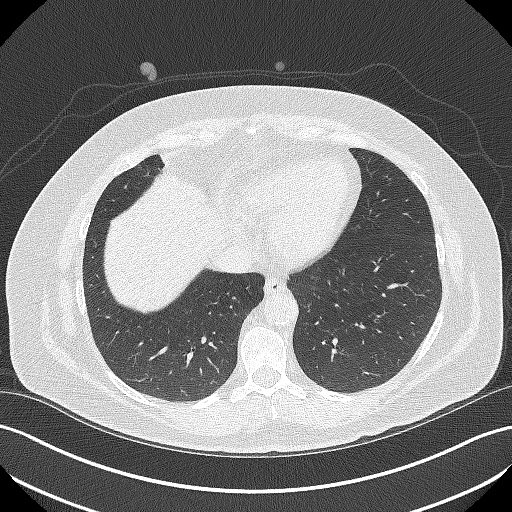
[im 216/476  mediastinal]
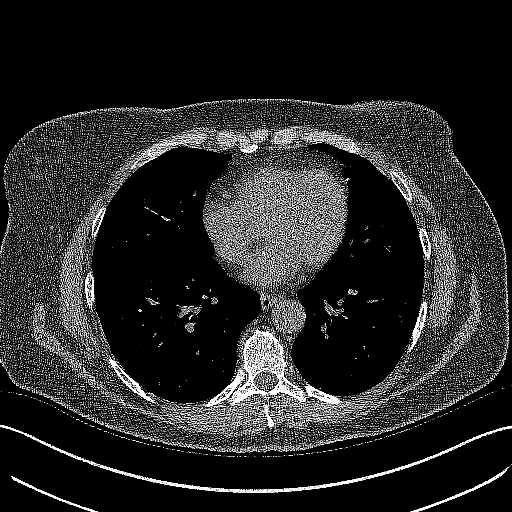
[im 216/476  lung]
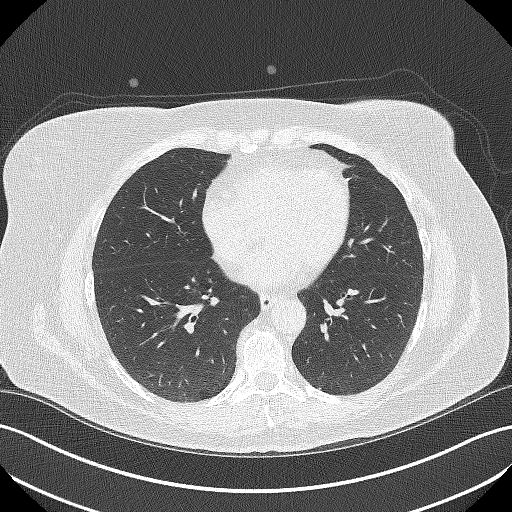
[im 260/476  lung]
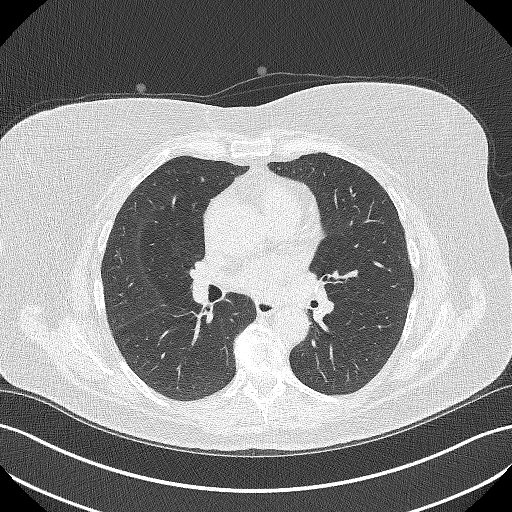
[im 303/476  lung]
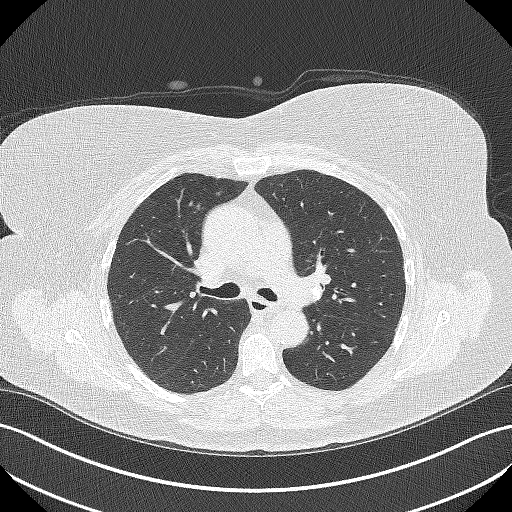
[im 346/476  lung]
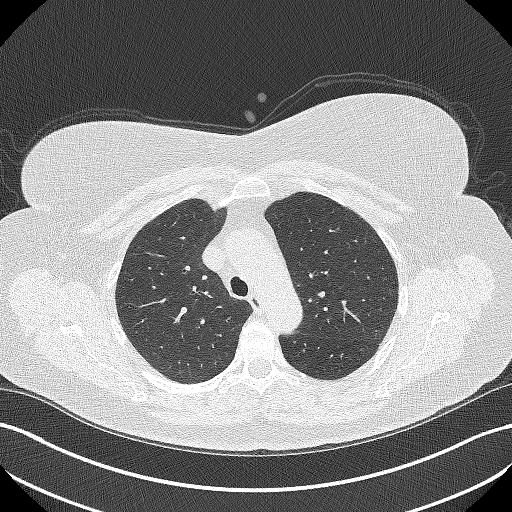
[im 389/476  mediastinal]
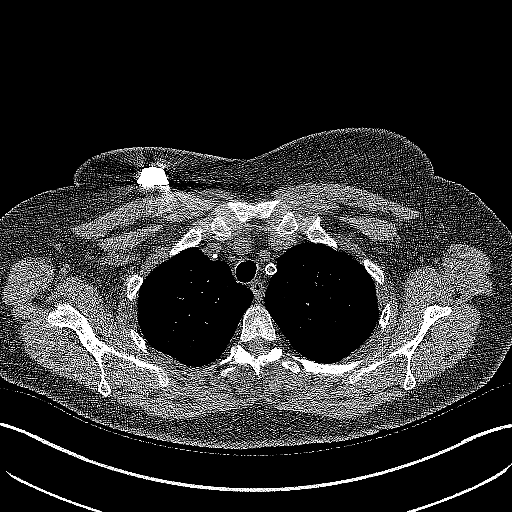
[im 389/476  lung]
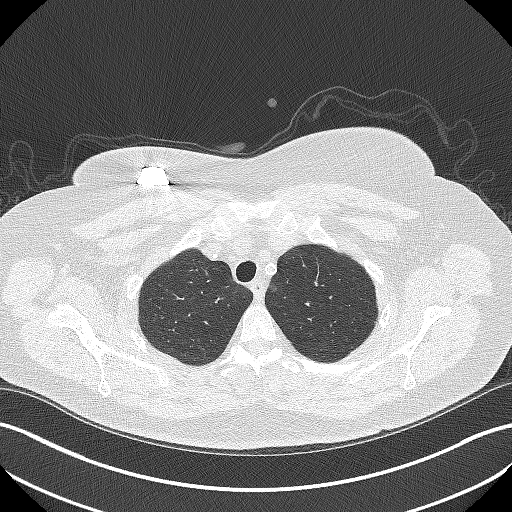
[im 432/476  lung]
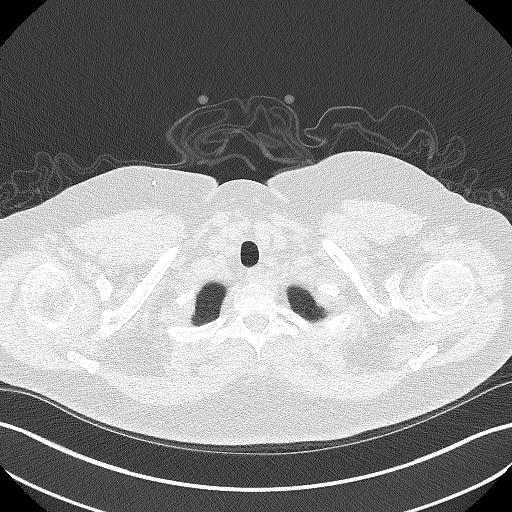

[11 of 36 positions shown; findings below may reference images not displayed]

------------- REPORT [id number] -------------
EXAM

Low-dose noncontrast CT lung screening

INDICATION

screen lung ca
PT STATES CURRENT SMOKER. 1 PACK PER DAY X 35 YEARS. CT/NM 0/0. TJ

FINDINGS

Low-dose noncontrast CT lung screening was performed.

There are no pulmonary nodules. There is a calcified granuloma in the left upper lobe.

There are calcified hilar and mediastinal lymph nodes.

There are coronary artery calcifications.

IMPRESSION

There are no pulmonary nodules.

Tech Notes:

PT STATES CURRENT SMOKER. 1 PACK PER DAY X 35 YEARS. CT/NM 0/0. TJ

------------- REPORT [id number] -------------
**ADDENDUM**

Lung screen annual follow up.

TD/TT: /

EXAM

Low-dose noncontrast CT lung screening

INDICATION

screen lung ca
PT STATES CURRENT SMOKER. 1 PACK PER DAY X 35 YEARS. CT/NM 0/0. TJ

FINDINGS

Low-dose noncontrast CT lung screening was performed.

There are no pulmonary nodules. There is a calcified granuloma in the left upper lobe.

There are calcified hilar and mediastinal lymph nodes.

There are coronary artery calcifications.

IMPRESSION

There are no pulmonary nodules.

Tech Notes:

PT STATES CURRENT SMOKER. 1 PACK PER DAY X 35 YEARS. CT/NM 0/0. TJ

## 2021-05-31 IMAGING — CR [ID]
1 series · 1 of 1 positions shown · non-contrast
Comparison: none

[x chest ap]
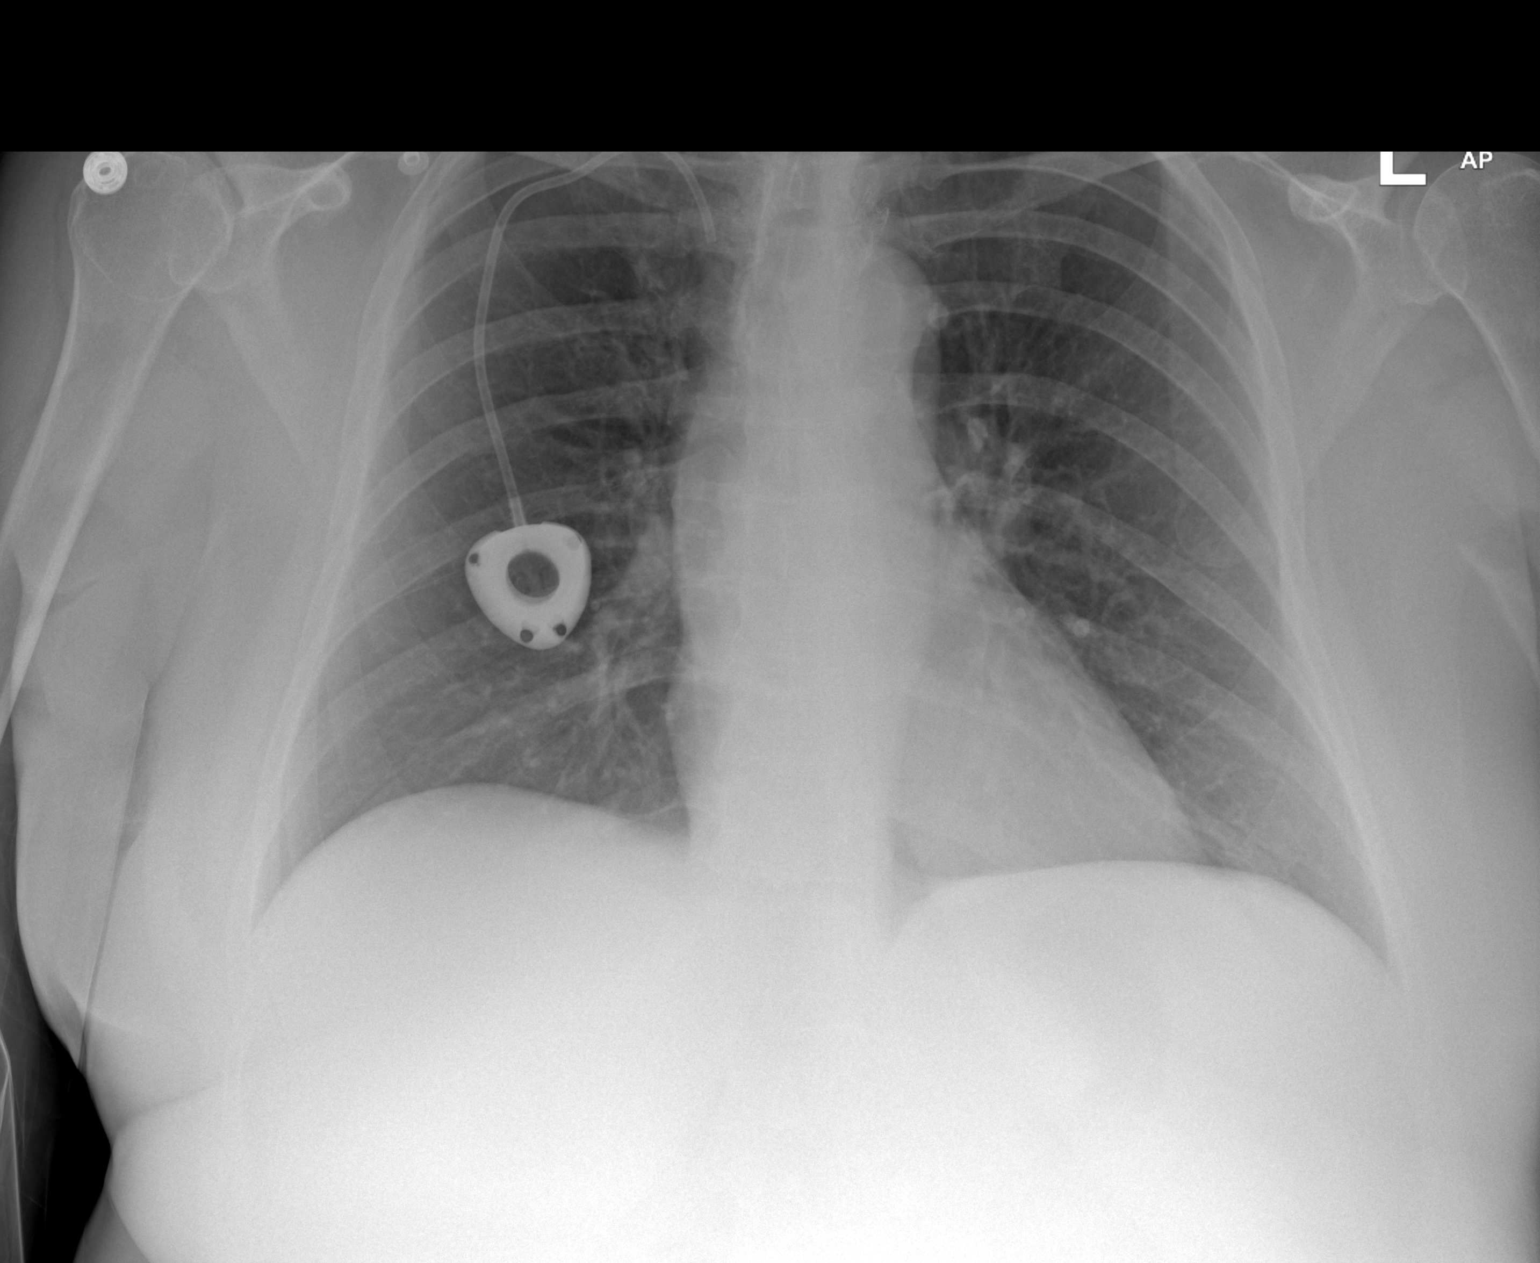

[1 of 1 positions shown; findings below may reference images not displayed]

DIAGNOSTIC STUDIES

EXAM

XR chest 1V

INDICATION

uncontrolled HTN
Uncontrolled HTN

TECHNIQUE

Portable AP chest.

COMPARISONS

October 02, 2017

FINDINGS

Cardiac silhouette is within normal limits. Right-sided Infuse-A-Port is in place. No acute
infiltrates are seen.

IMPRESSION

No acute infiltrates throughout the chest. Right Infuse-A-Port is in place.

Tech Notes:

Uncontrolled HTN

## 2021-06-07 ENCOUNTER — Ambulatory Visit: Admit: 2021-06-07 | Discharge: 2021-06-07 | Payer: MEDICARE

## 2021-06-07 ENCOUNTER — Encounter: Admit: 2021-06-07 | Discharge: 2021-06-07 | Payer: MEDICARE

## 2021-06-07 DIAGNOSIS — F17210 Nicotine dependence, cigarettes, uncomplicated: Secondary | ICD-10-CM

## 2021-06-07 DIAGNOSIS — I10 Essential (primary) hypertension: Secondary | ICD-10-CM

## 2021-06-07 DIAGNOSIS — R079 Chest pain, unspecified: Secondary | ICD-10-CM

## 2021-06-07 MED ORDER — PERFLUTREN LIPID MICROSPHERES 1.1 MG/ML IV SUSP
1-10 mL | Freq: Once | INTRAVENOUS | 0 refills | Status: CP | PRN
Start: 2021-06-07 — End: ?

## 2021-06-14 ENCOUNTER — Encounter: Admit: 2021-06-14 | Discharge: 2021-06-14 | Payer: MEDICARE

## 2021-06-14 NOTE — Telephone Encounter
06/14/2021  1105 Bro[address] with Dr. Carney Living office [phone]) inquiring if we received referral request and if pt has upcoming appt. Spoke with Lanora Manis in scheduling and they received referral for echo and pt had completed on 2/13.   06/14/2021 11:53 AM   Called Dr. Carney Living office and spoke with Carolina Sink as Nehemiah Settle is working remote. Informed of above information. Carolina Sink reports Dr. Herschell Dimes has requested new pt referral for [facility] at next time he is in [place]. They will refax referral request. Informed I would let our scheduling team know.  Staff msg to [place] to inform.

## 2021-06-17 ENCOUNTER — Encounter: Admit: 2021-06-17 | Discharge: 2021-06-17 | Payer: MEDICARE

## 2021-06-17 ENCOUNTER — Ambulatory Visit: Admit: 2021-06-17 | Discharge: 2021-06-17 | Payer: MEDICARE

## 2021-06-17 DIAGNOSIS — I10 Essential (primary) hypertension: Secondary | ICD-10-CM

## 2021-06-17 DIAGNOSIS — R079 Chest pain, unspecified: Secondary | ICD-10-CM

## 2021-06-24 ENCOUNTER — Encounter: Admit: 2021-06-24 | Discharge: 2021-06-24 | Payer: MEDICARE

## 2021-06-24 NOTE — Telephone Encounter
Left voicemail message with stress test results and call back number for further questions or concerns.

## 2021-06-24 NOTE — Telephone Encounter
-----   Message from Renard Matter, RN sent at 06/24/2021 10:51 AM CST -----    ----- Message -----  From: Altamease Oiler, MD  Sent: 06/24/2021  10:08 AM CST  To: [facility] Nurse Gen Card Team [place]    Please let her know that her stress test looks great.  Nothing to suggest blockages in the heart arteries.  Normal heart pump function.  Thank you

## 2021-07-08 ENCOUNTER — Encounter: Admit: 2021-07-08 | Discharge: 2021-07-08 | Payer: MEDICARE

## 2021-07-08 NOTE — Telephone Encounter
07/08/21 - STAT records request faxed to PCP - Dr. Lona Kettle - ph # [phone]; fax # [phone].    Also left a message for pt to call back to verify her cardiac hx if any.    JMR

## 2021-07-08 NOTE — Telephone Encounter
07/08/21 Pt called back and stated that she hasn't had any cardiac records in the past 15 years that she can remember but she did suggest getting records from [facility] in [place] and [place]. I will request records today edh

## 2021-07-26 ENCOUNTER — Encounter: Admit: 2021-07-26 | Discharge: 2021-07-26 | Payer: MEDICARE

## 2021-07-26 NOTE — Progress Notes
Records Request-STAT    Medical records request for continuation of care:    Patient has appointment TODAY   with  Dr. Avie Arenas    Please fax records to [facility] [phone]    Request records:    Molly [name]  [birth year]    ANY CARDIAC/VASCULAR RECORDS SINCE 2011    SUBCLAVIAN/ BRACHIAL ARTERY ANGIOPLASTY W/ STENTING 03/2010-PROCEDURE REPORT    Thank you,      Cardiovascular Medicine  [facility]  [address]  [place], [place] [address]  Phone:  [phone]  Fax:  [phone]

## 2021-07-27 ENCOUNTER — Encounter: Admit: 2021-07-27 | Discharge: 2021-07-27 | Payer: MEDICARE

## 2021-07-27 DIAGNOSIS — Z720 Tobacco use: Secondary | ICD-10-CM

## 2021-07-27 DIAGNOSIS — B1910 Unspecified viral hepatitis B without hepatic coma: Secondary | ICD-10-CM

## 2021-07-27 DIAGNOSIS — I10 Essential (primary) hypertension: Secondary | ICD-10-CM

## 2021-07-27 DIAGNOSIS — G43819 Other migraine, intractable, without status migrainosus: Secondary | ICD-10-CM

## 2021-07-27 DIAGNOSIS — G35 Multiple sclerosis: Secondary | ICD-10-CM

## 2021-07-27 DIAGNOSIS — I739 Peripheral vascular disease, unspecified: Secondary | ICD-10-CM

## 2021-07-27 DIAGNOSIS — Z79631 On methotrexate therapy: Secondary | ICD-10-CM

## 2021-07-27 DIAGNOSIS — E7800 Pure hypercholesterolemia, unspecified: Secondary | ICD-10-CM

## 2021-07-27 DIAGNOSIS — Z136 Encounter for screening for cardiovascular disorders: Secondary | ICD-10-CM

## 2021-07-27 DIAGNOSIS — L409 Psoriasis, unspecified: Secondary | ICD-10-CM

## 2021-07-27 DIAGNOSIS — R519 Generalized headaches: Secondary | ICD-10-CM

## 2021-07-27 DIAGNOSIS — I701 Atherosclerosis of renal artery: Secondary | ICD-10-CM

## 2021-07-27 DIAGNOSIS — L4050 Arthropathic psoriasis, unspecified: Secondary | ICD-10-CM

## 2021-07-27 DIAGNOSIS — G458 Other transient cerebral ischemic attacks and related syndromes: Secondary | ICD-10-CM

## 2021-07-27 MED ORDER — OLMESARTAN 20 MG PO TAB
10 mg | ORAL_TABLET | Freq: Two times a day (BID) | ORAL | 3 refills | 90.00000 days | Status: AC
Start: 2021-07-27 — End: ?

## 2021-07-27 MED ORDER — AMLODIPINE 5 MG PO TAB
5 mg | ORAL_TABLET | Freq: Two times a day (BID) | ORAL | 3 refills | Status: AC
Start: 2021-07-27 — End: ?

## 2021-07-27 NOTE — Progress Notes
Date of Service: 07/27/2021    Molly [name] is a 61 y.o. female.       HPI       Molly [name] is a 61 y.o. white female with a history of primary hypertension (currently treated with amlodipine, hydrochlorothiazide and olmesartan), history of episodes of hypotension that required hospitalization, history of deep venous thrombosis of the left upper extremity in 2011, history of subclavian steal syndrome, status post angioplasty of the left subclavian and brachial artery with stenting on 04/21/2010, multiple sclerosis, fibromyalgia, history of intolerance to MS medications, psoriasis with generalized, pustular  lesions and psoriatic arthritis, present tobacco use.    Recently patient was evaluated with the following:    1.  Perfusion imaging study dated 06/14/2021- the tomographic pattern did not demonstrate ischemia, normal LVEF = 61%  2.  A 2D echo Doppler study dated 06/07/2021-normal LVEF = 65% without any regional WMA, right ventricular cavity was normal in size with preserved systolic function, the valvular structures were poorly visualized but no significant abnormality appeared to be present, no pericardial effusion  3.  Renal ultrasound arterial duplex dated 06/07/2021- findings concerning for left renal artery stenosis, a CT angiogram was recommended  4.  CTA of the abdomen and pelvis dated 06/11/2021-no significant stenosis within the left artery, mildly atrophic right kidney with mildly decreased perfusion, diminutive right renal artery with focal high-grade stenosis at the origin.    Patient does not report symptoms of chest pain or heart palpitations.    Family history: Father had a myocardial infarction before age 52.    Social history: Patient smokes half pack of cigarettes a day.  She drinks alcohol occasionally.  She is on disability due to multiple sclerosis.         Vitals:    07/27/21 1321   BP: 118/68   BP Source: Arm, Right Upper   Pulse: 97   SpO2: 99%   O2 Device: None (Room air)   PainSc: Zero Weight: 79.8 kg (176 lb)   Height: 162.6 cm (5' 4)     Body mass index is 30.21 kg/m?Marland Kitchen     Past Medical History  Patient Active Problem List    Diagnosis Date Noted   ? Back pain 07/26/2021   ? Migraines 07/26/2021   ? Psoriatic arthritis (HCC) 07/26/2021   ? Subclavian steal syndrome 07/26/2021   ? Renal artery stenosis (HCC) 07/26/2021     06/11/21- [facility]- CT ABD& Pelvis with contrast- No significant stenosis within the left renal artery. Mildly atrophic right kidney with mildly decreased perfusion. Diminutive right renal artery with a focal high-grade stenosis at the origin.      ? Fibromyalgia 07/26/2021   ? Hypertension 07/26/2021     -06/07/21- [facility]- Echo- LV size is normal LVEF 65%. No doppler evidence of hemodynamically significant valvular disease. No pericardial effusion.   - 06/17/21- [facility]- Stress MPI- The study is normal with no evidence of myocardial ischemia. LV systolic function is normal. No high risk prognostic indicators present.   - 06/07/21- [facility]- Renal Duplex- Findings concerning for left renal artery stenosis.      ? Multiple sclerosis (HCC) 01/21/2021   ? Anxiety 01/21/2021   ? 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 Psoriasis 01/21/2021   ? Peripheral vascular disease (HCC) 05/25/2010     Formatting of this note might be different from the original.  ICD-10 conversion     ? Hyperlipidemia 09/30/2009     Formatting  of this note might be different from the original.  ICD10     ? Major depressive disorder, single episode 09/30/2009     Formatting of this note might be different from the original.  ICD-10 conversion     ? Palpitations 09/07/2009         Review of Systems   Constitutional: Negative.   HENT: Negative.    Eyes: Negative.    Cardiovascular: Negative.    Respiratory: Negative.    Endocrine: Negative.    Hematologic/Lymphatic: Negative.    Skin: Negative.    Musculoskeletal: Negative.    Gastrointestinal: Negative. Genitourinary: Negative.    Neurological: Negative.    Psychiatric/Behavioral: Negative.    Allergic/Immunologic: Negative.        Physical Exam  General Appearance: normal in appearance  Skin: warm, moist, no ulcers or xanthomas, pustular lesions, generalized   Eyes: conjunctivae and lids normal, pupils are equal and round  Lips & Oral Mucosa: no pallor or cyanosis  Neck Veins: neck veins are flat, neck veins are not distended  Chest Inspection: chest is normal in appearance  Respiratory Effort: breathing comfortably, no respiratory distress  Auscultation/Percussion: lungs clear to auscultation, no rales or rhonchi, no wheezing  Cardiac Rhythm: regular rhythm and normal rate  Cardiac Auscultation: S1, S2 normal, no rub, no gallop  Murmurs: no murmur  Carotid Arteries: normal carotid upstroke bilaterally, no bruit  Lower Extremity Edema: no lower extremity edema  Abdominal Exam: soft, non-tender, no masses, bowel sounds normal  Liver & Spleen: no organomegaly  Language and Memory: patient responsive and seems to comprehend information  Neurologic Exam: neurological assessment grossly intact      Cardiovascular Studies  Twelve-lead EKG demonstrates normal sinus rhythm, 1 PVC is present, ventricular rate 93 bpm, PR interval 134 ms, normal QT/QTc, 364/452 ms, no ST segment T wave changes and no axis deviation.    Cardiovascular Health Factors  Vitals BP Readings from Last 3 Encounters:   07/27/21 118/68   06/07/21 (!) 140/90     Wt Readings from Last 3 Encounters:   07/27/21 79.8 kg (176 lb)   06/07/21 79 kg (174 lb 2.6 oz)     BMI Readings from Last 3 Encounters:   07/27/21 30.21 kg/m?   06/07/21 29.90 kg/m?      Smoking Social History     Tobacco Use   Smoking Status Never   Smokeless Tobacco Never      Lipid Profile No results found for: CHOL  No results found for: HDL  No results found for: LDL  No results found for: TRIG   Blood Sugar No results found for: HGBA1C  No results found for: GLU, GLUF, GLUPOC Problems Addressed Today  Encounter Diagnoses   Name Primary?   ? Screening for heart disease Yes   ? Renal artery stenosis (HCC)    ? Subclavian steal syndrome    ? Peripheral vascular disease (HCC)    ? Other migraine without status migrainosus, intractable    ? Primary hypertension    ? Pure hypercholesterolemia    ? Psoriatic arthritis (HCC)    ? Multiple sclerosis (HCC)    ? Tobacco use        Assessment and Plan     Assessment:    1.  Peripheral arterial disease-currently asymptomatic  2.  History of left subclavian steal syndrome  3.  History of angioplasty of the left subclavian and brachial artery in December 2011-patient continues on clopidogrel  4.?  History  of DVT of the left arm in 2011-this is documented in her past medical history  5.  Right renal artery stenosis- this was more accurately diagnosed by a recent CT of the abdomen dated 06/11/2021  6.  Primary hypertension-currently on a combination of dihydropyridine calcium channel blocker, ARB and thiazide diuretic  7.  History of hypotension-the circumstances of this are unclear, but they might have occurred in the setting of overmedication with antihypertensive drug therapy  8.  Present use of tobacco-patient smokes approximately half pack cigarettes a day  9.  Family history of premature coronary artery disease-patient's father did have an MI before age 58  81.  Multiple sclerosis-patient reports being on several medications in the past, she has been experiencing side effects, her current symptoms are stable  11.  Psoriasis and psoriatic arthritis-patient is on methotrexate    Plan:    1.  Discontinue hydrochlorothiazide  2.  Split amlodipine into 5 mg p.o. twice daily  3.  Split olmesartan into 10 mg p.o. twice daily-at present time patient takes half of a 40 mg tablet  4.  Wellbutrin for smoking cessation  5.  Further appointment with Dr. Chales Abrahams in [place] clinic to discuss the abnormal findings of the renal artery stenosis and whether further intervention is warranted  6.  I highly recommended overall risk factors modification, low-fat, low-cholesterol, low sugar, low carbohydrate, low-fat diet and in particular cessation of all tobacco products.      Total Time Today was 60 minutes in the following activities: Preparing to see the patient, Obtaining and/or reviewing separately obtained history, Performing a medically appropriate examination and/or evaluation, Counseling and educating the patient/family/caregiver, Ordering medications, tests, or procedures, Referring and communication with other health care professionals (when not separately reported), Documenting clinical information in the electronic or other health record, Independently interpreting results (not separately reported) and communicating results to the patient/family/caregiver and Care coordination (not separately reported)         Current Medications (including today's revisions)  ? amitriptyline (ELAVIL) 25 mg tablet Take one tablet by mouth at bedtime daily.   ? amLODIPine (NORVASC) 10 mg tablet Take one tablet by mouth daily.   ? calcipotriene (DOVONEX) 0.005 % crea Apply  topically to affected area twice daily.   ? clobetasoL (TEMOVATE) 0.05 % topical ointment Apply  topically to affected area twice daily.   ? clonazePAM (KLONOPIN) 1 mg tablet Take one tablet by mouth three times daily.   ? clonidine hcl (KAPVAY) 0.1 mg ER tablet Take one tablet by mouth as Needed.   ? clopiDOGrel (PLAVIX) 75 mg tablet Take one tablet by mouth daily.   ? diphenhydrAMINE-acetaminophen 25-500 mg tab tablet Take 500 tablets by mouth daily. Taking 1000 mg QHS   ? folic acid (FOLVITE) 1 mg tablet Take one tablet by mouth daily.   ? gabapentin (NEURONTIN) 300 mg capsule Take three capsules by mouth nightly as needed.   ? hydroCHLOROthiazide (HYDRODIURIL) 25 mg tablet Take one tablet by mouth every morning.   ? levocetirizine (XYZAL) 5 mg tablet Take one tablet by mouth daily.   ? methotrexate sodium 2.5 mg tablet Take six tablets by mouth every 7 days.   ? olmesartan (BENICAR) 40 mg tablet Take one-half tablet by mouth daily after dinner.   ? tiZANidine (ZANAFLEX) 4 mg tablet Take two tablets by mouth three times daily. (Patient taking differently: Take 1.5 tablets by mouth at bedtime as needed.)

## 2021-07-27 NOTE — Patient Instructions
Thank you for visiting our office today.    We would like to make the following medication adjustments:      Stop hydrochlorothyazide.  Change Olmesartan to 10 mg twice daily and amlodipine 5 mg twice daily.  Wellbutrin for smoking cessation.       Otherwise continue the same medications as you have been doing.          We will be pursuing the following tests after your appointment today:       Orders Placed This Encounter    Referral to Vascular    ECG 12-LEAD    amLODIPine (NORVASC) 5 mg tablet    olmesartan (BENICAR) 20 mg tablet       Please call us in the meantime with any questions or concerns.        Please allow 5-7 business days for our providers to review your results. All normal results will go to MyChart. If you do not have Mychart, it is strongly recommended to get this so you can easily view all your results. If you do not have mychart, we will attempt to call you once with normal lab and testing results. If we cannot reach you by phone with normal results, we will send you a letter.  If you have not heard the results of your testing after one week please give [place] a call.       Your Cardiovascular Medicine [place] Team Brett Canales, Pilar Jarvis and [place])  phone number is [phone].

## 2021-07-28 ENCOUNTER — Encounter: Admit: 2021-07-28 | Discharge: 2021-07-28 | Payer: MEDICARE

## 2021-07-28 NOTE — Telephone Encounter
07-28-2021 Per Task Message, no records needed, clp    No outside records

## 2021-07-30 ENCOUNTER — Encounter: Admit: 2021-07-30 | Discharge: 2021-07-30 | Payer: MEDICARE

## 2021-07-30 NOTE — Progress Notes
Patient coming in at the referral of Dr Avie Arenas for evaluation of renal artery stenosis. Per Dr Avie Arenas note, patient had a CT Abdomen/Pelvis and Renal Artery Ultrasound 05/2021. Do not see reports in patient chart.     Called and spoke with Dibena in medical records at [facility]. Requested paper records and also asked that images be uploaded to [employer]. Verified fax number with her and she will make sure images uploaded. LN

## 2021-07-30 NOTE — Progress Notes
STAT    Request for the following medical records from the office of Dr Lona Kettle for purpose of continuity of care.       Please Fax:    Most recent lab/lipid results    Please fax to: [phone]  Cardiology Services at the [facility]  Dr Jeananne Rama  Attention: Medical Records    Thank you

## 2021-08-03 ENCOUNTER — Ambulatory Visit: Admit: 2021-08-03 | Discharge: 2021-08-04 | Payer: MEDICARE

## 2021-08-03 ENCOUNTER — Encounter: Admit: 2021-08-03 | Discharge: 2021-08-03 | Payer: MEDICARE

## 2021-08-03 VITALS — BP 132/92 | HR 100 | Ht 64.0 in | Wt 176.0 lb

## 2021-08-03 VITALS — BP 129/78 | HR 91

## 2021-08-03 DIAGNOSIS — G35 Multiple sclerosis: Secondary | ICD-10-CM

## 2021-08-03 DIAGNOSIS — B1910 Unspecified viral hepatitis B without hepatic coma: Secondary | ICD-10-CM

## 2021-08-03 DIAGNOSIS — R519 Generalized headaches: Secondary | ICD-10-CM

## 2021-08-03 DIAGNOSIS — L409 Psoriasis, unspecified: Secondary | ICD-10-CM

## 2021-08-03 DIAGNOSIS — Z720 Tobacco use: Secondary | ICD-10-CM

## 2021-08-03 NOTE — Progress Notes
Date of Service: 08/03/2021    Molly [name] is a 61 y.o. female.       HPI      Ms. 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  She is accompanied today by her husband.  They live in [place] which [place] close to [place] and do not like coming to the city if it can be helped    The patient had a renal artery duplex that showed a small right kidney and then a CT with contrast that I have reviewed.  The CT shows moderate calcification in the abdominal aorta without obvious stenosis but the right kidney is atretic with slight contrast furcation size of 7.2 cm compared to 9.5 cm on the left side.  The right renal artery is very small what appears to be a subtotal ostial lesion but appears to be the main renal artery and not an accessory.  Contralateral renal arteries mostly patient with mild plaquing.  This was an incidental finding.  Hypertension but is doing better now.    She does give a history of occultly walking but it appears to be more related to psoriasis involving the Achilles tendon she sees than true claudication.  She has no discoloration of the feet or toes.    She follows with Dr. Radford Pax for cardiovascular risk modification.    Past medical history  1. Multiple sclerosis, longstanding  2. 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 Migraines  4. Dyslipidemia  5. Hypertension  6. Chronic back pain  7. Hepatitis B infection in the past  8. History of major depressive disorder    Social history: Is married and was accompanied by her husband today.  Does use tobacco.         Vitals:    08/03/21 0843 08/03/21 0844   BP: (!) 132/92 129/78   BP Source: Arm, Right Upper Arm, Left Upper   Pulse: 100 91   Weight: 79.8 kg (176 lb)    Height: 162.6 cm (5' 4)      Body mass index is 30.21 kg/m?Marland Kitchen     Past Medical History  Patient Active Problem List Diagnosis Date Noted   ? Tobacco use 07/27/2021   ? On methotrexate therapy 07/27/2021   ? Back pain 07/26/2021   ? Migraines 07/26/2021   ? Psoriatic arthritis (HCC) 07/26/2021   ? Subclavian steal syndrome 07/26/2021   ? Renal artery stenosis [facility]) 07/26/2021     06/07/2021- Renal Artery Dopper ([facility]): Findings concerning for left renal artery stenosis.   06/11/21- [facility]- CT ABD& Pelvis with contrast- No significant stenosis within the left renal artery. Mildly atrophic right kidney with mildly decreased perfusion. Diminutive right renal artery with a focal high-grade stenosis at the origin.      ? Fibromyalgia 07/26/2021   ? Hypertension 07/26/2021     -06/07/21- [facility]- Echo- LV size is normal LVEF 65%. No doppler evidence of hemodynamically significant valvular disease. No pericardial effusion.   - 06/17/21- [facility]- Stress MPI- The study is normal with no evidence of myocardial ischemia. LV systolic function is normal. No high risk prognostic indicators present.   - 06/07/21- [facility]- Renal Duplex- Findings concerning for left renal artery stenosis.      ? Multiple sclerosis (HCC) 01/21/2021   ? Anxiety 01/21/2021   ? 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 Psoriasis 01/21/2021   ? Peripheral vascular disease (HCC) 05/25/2010     Formatting of this note might be different from the original.  ICD-10 conversion     ? Hyperlipidemia 09/30/2009     Formatting of this note might be different from the original.  ICD10     ? Major depressive disorder, single episode 09/30/2009     Formatting of this note might be different from the original.  ICD-10 conversion     ? Palpitations 09/07/2009         Review of Systems   Constitutional: Positive for malaise/fatigue.        Activity decreased     Cardiovascular: Positive for irregular heartbeat.   Musculoskeletal: Positive for back pain, joint pain and myalgias.   Gastrointestinal: Positive for bloating, abdominal pain, change in bowel habit, constipation and diarrhea.   Neurological: Positive for dizziness, headaches and light-headedness.   All other systems reviewed and are negative.      Physical Exam    Physical Exam  General Appearance: no acute distress   Skin: warm, moist, no ulcers   HENT: unremarkable   Eyes; Pupils are round and reactive. No icterus  Neck Veins: neck veins are flat, neck veins are not distended   Carotid Arteries: normal carotid upstroke bilaterally, no bruits   Chest Inspection: chest is normal in appearance   Auscultation/Percussion: lungs clear to auscultation, no rales, rhonchi, or wheezing   Cardiac Rhythm: regular rhythm and normal rate   Cardiac Auscultation: Normal S1 & S2, no S3 or S4, no rub   Murmurs: no cardiac murmurs   Extremities: All pedal pulses are normal, no pedal edema.  Abdominal Exam: soft, non-tender, no masses, bowel sounds normal   Liver & Spleen: no organomegaly   Neurologic Exam: neurological assessment grossly intact        Cardiovascular Health Factors  Vitals BP Readings from Last 3 Encounters:   08/03/21 129/78   07/27/21 118/68   06/07/21 (!) 140/90     Wt Readings from Last 3 Encounters:   08/03/21 79.8 kg (176 lb)   07/27/21 79.8 kg (176 lb)   06/07/21 79 kg (174 lb 2.6 oz)     BMI Readings from Last 3 Encounters:   08/03/21 30.21 kg/m?   07/27/21 30.21 kg/m?   06/07/21 29.90 kg/m?      Smoking Social History     Tobacco Use   Smoking Status Never   Smokeless Tobacco Never      Lipid Profile No results found for: CHOL  No results found for: HDL  No results found for: LDL  No results found for: TRIG   Blood Sugar No results found for: HGBA1C  No results found for: GLU, GLUF, GLUPOC       Problems Addressed Today  No diagnosis found.    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  The right renal artery is extremely small and likely subtotally occluded at its origin.  He is doing better with blood pressure control.  Contralateral kidney is of normal size with normal renal artery without significant stenosis.    I had a good discussion with the patient when I showed her images of the CT scan with her and her husband in detail.  We explained that I did not believe his revascularization of this renal artery helped her  much in the short or long run with either blood pressure control or improvement in kidney function.  Patient the size of the kidney is likely not much meaningful functional left.    We discussed the key would be proper blood pressure control with medications and smoking cessation control of risk such as high cholesterol to prevent the left kidney from meeting the same fate.    The patient is quite reluctant to travel to [facility] and prefers care closer to home as we have not scheduling a follow-up visit but we discussed following closely with Dr. Radford Pax in [place] as well as Dr Herschell Dimes closer to home.    We have given her a prescription to getting her blood work drawn including lipids.  We will make recommendations in regards to statin therapy and then she will follow-up with Dr. Radford Pax going forward.  She has had bad experience with atorvastatin and this alternates will need to be tried.    This note was dictated using the dragon speech recognition software.  Transcription errors may occur with the use of this software. Editing and proofreading were done by the author of this document.  In spite of the author's best effort to identify every error introduced by voice to text dictation, some errors that may represent misspelling or misstatements of what was dictated may persist.  If there are questions about content in this document please contact Dr. Harley Alto.             Current Medications (including today's revisions)  ? amitriptyline (ELAVIL) 25 mg tablet Take one tablet by mouth at bedtime daily.   ? amLODIPine (NORVASC) 5 mg tablet Take one tablet by mouth twice daily.   ? calcipotriene (DOVONEX) 0.005 % crea Apply  topically to affected area twice daily.   ? clobetasoL (TEMOVATE) 0.05 % topical ointment Apply  topically to affected area twice daily.   ? clonazePAM (KLONOPIN) 1 mg tablet Take one tablet by mouth three times daily.   ? clonidine hcl (KAPVAY) 0.1 mg ER tablet Take one tablet by mouth as Needed.   ? clopiDOGrel (PLAVIX) 75 mg tablet Take one tablet by mouth daily.   ? diphenhydrAMINE-acetaminophen 25-500 mg tab tablet Take 500 tablets by mouth daily. Taking 1000 mg QHS   ? folic acid (FOLVITE) 1 mg tablet Take one tablet by mouth daily.   ? gabapentin (NEURONTIN) 300 mg capsule Take three capsules by mouth nightly as needed.   ? levocetirizine (XYZAL) 5 mg tablet Take one tablet by mouth daily.   ? methotrexate sodium 2.5 mg tablet Take six tablets by mouth every 7 days.   ? olmesartan (BENICAR) 20 mg tablet Take one-half tablet by mouth twice daily.   ? tiZANidine (ZANAFLEX) 4 mg tablet Take two tablets by mouth three times daily. (Patient taking differently: Take 1.5 tablets by mouth at bedtime as needed.)

## 2021-08-03 NOTE — Patient Instructions
Thank you so much for seeing [place] today! Dr. Chales Abrahams would like for you to do the following,     Complete labwork at your convenience. You should be fasting  Follow up with Dr Avie Arenas in 6 months    It was a pleasure to see you, Please send a Mychart message or call Luster Landsberg, RN at [phone] if you have any questions. Thank you and have a great day!  It was good to see you today!     Please contact the cardiology [place] Team at [phone] or send a message through the MyChart system if you have questions or concerns. We will get back to you as soon as possible.     NOTE: MyChart messages and phone calls received on weekends, on holidays, and after 4 pm on weekdays will NOT be seen until the following business day. If you have an urgent matter during these times, please call [phone] to reach the on-call team.     You may receive test results in MyChart before the ordering provider has reviewed them. Our care team will follow up with you after reviewing the tests to discuss your care. This may take up to 5-7 business days if results are not urgently needing to be addressed. Thank you for your patience.      If you need prescription refills, please contact your pharmacy.  Cardiology scheduling number: [phone]

## 2021-08-04 ENCOUNTER — Encounter: Admit: 2021-08-04 | Discharge: 2021-08-04 | Payer: MEDICARE

## 2021-08-04 DIAGNOSIS — E7800 Pure hypercholesterolemia, unspecified: Secondary | ICD-10-CM

## 2021-08-04 DIAGNOSIS — I701 Atherosclerosis of renal artery: Principal | ICD-10-CM

## 2021-08-04 DIAGNOSIS — I739 Peripheral vascular disease, unspecified: Secondary | ICD-10-CM

## 2021-08-12 ENCOUNTER — Encounter: Admit: 2021-08-12 | Discharge: 2021-08-12 | Payer: MEDICARE

## 2021-08-12 DIAGNOSIS — E7800 Pure hypercholesterolemia, unspecified: Secondary | ICD-10-CM

## 2021-08-12 LAB — LIPID PROFILE
CHOLESTEROL/HDL %: 5
CHOLESTEROL: 231 — ABNORMAL HIGH (ref <200)
HDL: 44
LDL: 145 — ABNORMAL HIGH (ref <100)
TRIGLYCERIDES: 212 — ABNORMAL HIGH (ref <150)
VLDL: 42 — ABNORMAL HIGH (ref 5–40)

## 2021-08-12 LAB — LIVER FUNCTION PANEL: TOTAL BILIRUBIN: 0.6

## 2021-08-17 ENCOUNTER — Encounter: Admit: 2021-08-17 | Discharge: 2021-08-17 | Payer: MEDICARE

## 2021-08-18 ENCOUNTER — Encounter: Admit: 2021-08-18 | Discharge: 2021-08-18 | Payer: MEDICARE

## 2021-08-18 DIAGNOSIS — I10 Essential (primary) hypertension: Secondary | ICD-10-CM

## 2021-08-18 MED ORDER — OLMESARTAN 20 MG PO TAB
20 mg | ORAL_TABLET | Freq: Two times a day (BID) | ORAL | 1 refills | 90.00000 days | Status: AC
Start: 2021-08-18 — End: ?

## 2021-09-03 ENCOUNTER — Encounter: Admit: 2021-09-03 | Discharge: 2021-09-03 | Payer: MEDICARE

## 2021-09-08 ENCOUNTER — Encounter: Admit: 2021-09-08 | Discharge: 2021-09-08 | Payer: MEDICARE

## 2021-09-08 MED ORDER — EZETIMIBE 10 MG PO TAB
10 mg | ORAL_TABLET | Freq: Every day | ORAL | 1 refills | Status: AC
Start: 2021-09-08 — End: ?

## 2021-09-08 NOTE — Telephone Encounter
-----   Message from Rogelia Boga, RN sent at 08/17/2021 12:27 PM CDT -----  Regarding: FW: Would you ask [facility] about LDL  Pt wants to verify that [facility] is aware of her MS and psoratic arthritis before she agrees to trying med.  Please ask [facility] and f//u with patient.  ----- Message -----  From: Hortencia Pilar, RN  Sent: 08/13/2021   1:15 PM CDT  To: Rogelia Boga, RN  Subject: RE: Would you ask [facility] about LDL                  Sophronia Simas,    [facility] says to add Zetia 10 mg daily. Plan to repeat FLP in 2 months.    Thanks,  Jola Babinski    ----- Message -----  From: Rogelia Boga, RN  Sent: 08/13/2021  10:55 AM CDT  To: [facility] [place]  Subject: Would you ask [facility] about LDL                      Bernyce saw Dr. Harley Alto and he ordered FLP.  His team asked [place] to reach out to Dr. Avie Arenas as patient will not be following with him.  Pt's LDL is 145.  She has had adverse reactions to atorvastatin in the past.  She is not currently taking any cholesterol medication.    Would you mind asking her if she would like to try a different statin - maybe crestor to see if patient tolerates?    If not, let me know and I can text her.    Thank you ladies for your help as always!  Asher Muir

## 2021-09-08 NOTE — Telephone Encounter
Discussed in clinic with [facility] by Debbe Odea, RN.  Pt is okay to take medication with MS and psoratic arthritis.  Left message with recommendations.  Requested callback to see if patient is willing to try medication.

## 2021-09-14 ENCOUNTER — Encounter: Admit: 2021-09-14 | Discharge: 2021-09-14 | Payer: MEDICARE

## 2021-09-14 ENCOUNTER — Ambulatory Visit: Admit: 2021-09-14 | Discharge: 2021-09-15 | Payer: MEDICARE

## 2021-09-14 DIAGNOSIS — Z720 Tobacco use: Secondary | ICD-10-CM

## 2021-09-14 DIAGNOSIS — L409 Psoriasis, unspecified: Secondary | ICD-10-CM

## 2021-09-14 DIAGNOSIS — B181 Chronic viral hepatitis B without delta-agent: Secondary | ICD-10-CM

## 2021-09-14 DIAGNOSIS — G35 Multiple sclerosis: Secondary | ICD-10-CM

## 2021-09-14 DIAGNOSIS — B1910 Unspecified viral hepatitis B without hepatic coma: Secondary | ICD-10-CM

## 2021-09-14 DIAGNOSIS — R519 Generalized headaches: Secondary | ICD-10-CM

## 2021-09-14 MED ORDER — GABAPENTIN 300 MG PO CAP
900 mg | ORAL_CAPSULE | Freq: Three times a day (TID) | ORAL | 3 refills | Status: AC
Start: 2021-09-14 — End: ?

## 2021-09-14 NOTE — Progress Notes
Date of Service: 09/14/21    Subjective:       Chief Complaint:   Chief Complaint   Patient presents with   ? Follow Up     Multiple sclerosis         Obtained patient's verbal consent to treat them and their agreement to [facility] financial policy and NPP via this telehealth visit.     Molly [name] is a 61 year old left-handed woman who is well-known to me.  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  I last evaluated her in November 2022.  She was diagnosed with MS many years ago.  Her symptoms have largely involved severe muscle cramping, numbness and tingling, primarily in the lower extremities, but to some degree in the upper extremities.  At times she has experienced weakness in her limbs as well.  Her symptoms are much worse when exposed to extreme heat or cold.  She also has severe associated fatigue, and mild imbalance.     From January 2014 until spring 2018 she was on Tecfidera 240 mg twice daily.  She tolerated this well, but continued to have progression of her disease.  She was started on Tysabri in June 2018 and remained on this until late 2019, but developed severe generalized burning sensation in her body during and after her infusions, increasing with each infusion, so we discontinued it.  We checked her for antibody development to Tysabri, but these were negative, so it was unlikely she was actually experiencing allergic reaction.      Milliani experienced a fairly severe exacerbation in November 2020 and was treated with Acthar gel at that time.  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  She saw Dr. Cindi Carbon, gastroenterologist, and was placed on Entecavir.  After discussion with Dr. Darrin Luis, we agreed that it was reasonable for her to go ahead and initiate Ocrevus.  In December 2020 she underwent her first series of Ocrevus.  Other than mild associated flushing with the infusions, she tolerated these well.  She developed a severe rash on her feet, initially thought she had shingles, so decided to stop the infusions.  As it turns out, this was actually a severe flare of her psoriasis.  She has been seeing Dr. Assunta Gambles, dermatologist as well as Dr. Laneta Simmers rheumatologist for psoriasis with associated psoriatic arthritis.  She continues to be treated with methotrexate, now off of Ocrevus.  She is now off Entecavir, under the approval of Dr. Darrin Luis.     Tizanidine and gabapentin as well as lamotrigine continue to be helpful for her pain and muscle spasm.  Clonazepam is taken as needed to help her sleep as well as for severe muscle spasm unrelieved by above.     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  She remains on clopidogrel 75 mg daily.  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 Harley Alto who did not feel it was worthwhile to revascularize the kidney.     Repeat MRIs of the brain and thoracic spine in September 2022 were completely unchanged from prior study.  Laboratory studies from July 2022 were notable for MCV elevated at 100 however the remainder of her CBC with differential was unremarkable.  CMP was unremarkable.  Total cortisol level was within range at 6.2.  Medical History:   Diagnosis Date   ? Generalized headaches    ? Hepatitis B infection    ? Multiple sclerosis (HCC)    ? Psoriasis    ? Tobacco use 07/27/2021            Social Determinants of Health     Tobacco Use: Low Risk  (09/14/2021)    Patient History    ? Smoking Tobacco Use: Never    ? Smokeless Tobacco Use: Never    ? Passive Exposure: Not on file   Alcohol Use: Not on file (03/17/2021)   Financial Resource Strain: Not on file   Food Insecurity: Not on file   Transportation Needs: Not on file   Stress: Not on file   Social Connections: Not on file   Health Literacy: Not on file   Depression: Not at risk (09/14/2021)    PHQ-2    ? PHQ-2 Score: 0   Housing Stability: Not on file         Family History   Problem Relation Age of Onset   ? Cancer Mother    ? Alcohol abuse Mother    ? Cancer-Pancreas Mother    ? Glaucoma Mother    ? Hypertension Father    ? Coronary Artery Disease Father    ? Diabetes Type II Father    ? Heart Attack Father    ? Migraines Father    ? Seizures Son    ? Alcohol abuse Maternal Grandmother    ? Hypertension Paternal Grandmother    ? Diabetes Type II Paternal Grandmother    ? Hypertension Paternal Grandfather    ? Heart Attack Paternal Grandfather    ? Diabetes Type II Paternal Grandfather    ? Coronary Artery Disease Paternal Grandfather    ? Kidney Disease Paternal Grandfather    ? Cancer Other    ? Cancer Maternal Aunt    ? Arthritis-rheumatoid Paternal Aunt        Review of Systems  Obtained patient's verbal consent to treat them and their agreement to [facility] financial policy and NPP via this telehealth visit during the [employer] Emergency     Molly [name] is a 61 year old left-handed woman who is well-known to me.  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  I last evaluated her in October 2021.  She was diagnosed with MS many years ago.  Her symptoms have largely involved severe muscle cramping, numbness and tingling, primarily in the lower extremities, but to some degree in the upper extremities.  At times she has experienced weakness in her limbs as well.  Her symptoms are much worse when exposed to extreme heat or cold.  She also has severe associated fatigue, and mild imbalance.     From January 2014 until spring 2018 she was on Tecfidera 240 mg twice daily.  She tolerated this well, but continued to have progression of her disease.  She was started on Tysabri in June 2018 and remained on this until late 2019, but developed severe generalized burning sensation in her body during and after her infusions, increasing with each infusion, so we discontinued it.  We checked her for antibody development to Tysabri, but these were negative, so it was unlikely she was actually experiencing allergic reaction.      In 2020, Brylin experienced a fairly severe exacerbation in November 2020 and was treated with Acthar gel at that time.  She underwent pretesting for Ocrevus and was  found to be hepatitis B positive.  She saw Dr. Cindi Carbon, gastroenterologist, and since that time has been on Entecavir.  After discussion with Dr. Darrin Luis, we agreed that it was reasonable for her to go ahead and initiate Ocrevus.  In December 2020 she underwent her first series of Ocrevus.  Other than mild associated flushing with the infusions, she tolerated these well.  She developed a severe rash on her feet, initially thought she had shingles, so decided to stop the infusions.  As it turns out, this was actually a severe flare of her psoriasis.  She has been seeing Dr. Assunta Gambles, dermatologist as well as Dr. Laneta Simmers rheumatologist for psoriasis with associated psoriatic arthritis.  She continues to be treated with methotrexate, now off of methotrexate.     Tizanidine and gabapentin as well as lamotrigine continue to be helpful for her pain and muscle spasm.  Clonazepam is taken as needed to help her sleep as well as for severe muscle spasm unrelieved by above.     Annesa also has history of subclavian steal syndrome and continues to be a lifelong smoker, unable to quit in the past.  She remains on clopidogrel 75 mg daily.     Repeat MRIs of the brain and thoracic spine in September 2022 were completely unchanged from prior study.  Laboratory studies from July 2022 were notable for MCV elevated at 100 however the remainder of her CBC with differential was unremarkable.  CMP was unremarkable.  Total cortisol level was within range at 6.2.     Over the last 4 to 6 weeks, Naquana has had increasing pain.  She states I cannot stand anything to touch me.  We agreed for her to try increasing gabapentin from 900 mg twice daily to 1200 mg twice daily.  This has been helpful.  Dr. Herschell Dimes has been managing her hypertension.  Dyanna will be having cataract surgery in both eyes, 2 weeks apart in December 2022.         Objective:         Allergies   Allergen Reactions   ? Atorvastatin MUSCLE PAIN   ? Adhesive Tape (Rosins) RASH   ? Adhesive UNKNOWN   ? Cefdinir DIARRHEA   ? Doxycycline NAUSEA AND VOMITING       ? amitriptyline (ELAVIL) 25 mg tablet Take one tablet by mouth at bedtime daily.   ? amLODIPine (NORVASC) 5 mg tablet Take one tablet by mouth twice daily.   ? calcipotriene (DOVONEX) 0.005 % crea Apply  topically to affected area twice daily.   ? clobetasoL (TEMOVATE) 0.05 % topical ointment Apply  topically to affected area twice daily.   ? clonazePAM (KLONOPIN) 1 mg tablet Take one tablet by mouth three times daily.   ? clonidine hcl (KAPVAY) 0.1 mg ER tablet Take one tablet by mouth as Needed.   ? clopiDOGrel (PLAVIX) 75 mg tablet Take one tablet by mouth daily.   ? diphenhydrAMINE-acetaminophen 25-500 mg tab tablet Take 500 tablets by mouth daily. Taking 1000 mg QHS   ? ezetimibe (ZETIA) 10 mg tablet Take one tablet by mouth daily.   ? folic acid (FOLVITE) 1 mg tablet Take one tablet by mouth daily.   ? gabapentin (NEURONTIN) 300 mg capsule Take three capsules by mouth three times daily.   ? levocetirizine (XYZAL) 5 mg tablet Take one tablet by mouth daily.   ? methotrexate sodium 2.5 mg tablet Take six tablets by mouth every 7 days.   ?  olmesartan (BENICAR) 20 mg tablet Take one tablet by mouth twice daily.   ? sulfaSALAzine (AZULFIDINE) 500 mg tablet TAKE 2 TABLETS BY MOUTH EVERY 8 HOURS FOR 30 DAYS WITH FOOD (MEAL/SNACK)   ? tiZANidine (ZANAFLEX) 4 mg tablet Take two tablets by mouth three times daily. (Patient taking differently: Take 1.5 tablets by mouth at bedtime as needed.)     Vitals:    09/14/21 1447   PainSc: Five   Weight: 79.4 kg (175 lb)   Height: 162.6 cm (5' 4) Body mass index is 30.04 kg/m?Marland Kitchen     Physical Exam  Constitutional:       Appearance: Normal appearance.   HENT:      Head: Normocephalic and atraumatic.      Mouth/Throat:      Mouth: Mucous membranes are moist.   Eyes:      Extraocular Movements: Extraocular movements intact.      Pupils: Pupils are equal, round, and reactive to light.   Cardiovascular:      Rate and Rhythm: Normal rate and regular rhythm.   Pulmonary:      Effort: Pulmonary effort is normal. No respiratory distress.   Musculoskeletal:         General: No deformity. Normal range of motion.      Cervical back: Normal range of motion and neck supple.   Skin:     General: Skin is warm and dry.   Neurological:      Mental Status: She is alert.      Comments: Alert and oriented x4.  Recent and remote memory are intact.  Speech is normal.  Attention is excellent.  Cranial nerve examination reveals no facial asymmetry or decrease in facial sensation to the patient's touch.  Eye findings as discussed above.  Tongue and palate movements are normal.  Neck and shoulder strength appear intact.  Motor examination is without drift.  Strength is grossly normal throughout both upper as well as lower extremities proximally distally.    Reflexes and sensation could not be adequately assessed.  Coordination is excellent without evidence for dysmetria or ataxia in either of the upper lower extremities.  The patient was able to stand and ambulate without difficulty.   Psychiatric:         Mood and Affect: Mood normal.         Behavior: Behavior normal.     Office Procedure:    Laboratory studies:   Latest Reference Range & Units 08/12/21 00:00   Albumin  3.9   Total Bilirubin  0.66   Bilirubin, Direct  0.20   Total Protein  7.6   AST (SGOT)  15   ALT (SGPT)  20   Alk Phosphatase  109   Cholesterol <200  231 (H)   Triglycerides <150  212 (H)   HDL  44   LDL <100  145 (H)   VLDL 5 - 40  42 (H)   Cholesterol/HDL Ratio  5   (H): Data is abnormally high Assessment and Plan:    1. Multiple sclerosis (HCC)    2. Psoriasis    3. 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  She now has 2 additional fulminant autoimmune diseases: Ulcerative colitis as well as severe psoriatic arthritis.  She remains on methotrexate under the care of Dr. Claudean Severance.    I will plan to see her again for follow-up in 6 months  from now.

## 2021-11-23 ENCOUNTER — Encounter: Admit: 2021-11-23 | Discharge: 2021-11-23 | Payer: MEDICARE

## 2021-12-12 ENCOUNTER — Encounter: Admit: 2021-12-12 | Discharge: 2021-12-12 | Payer: MEDICARE

## 2021-12-14 IMAGING — CT BRAIN WO(Adult)
3 of 4 series · 14 of 47 positions shown, 16 images · non-contrast
Comparison: none

[Series 4: brain cor 5.00 hr40 s3 · coronal · 0.30mm/px · 3 of 39 slices shown]
[im 13/39  brain]
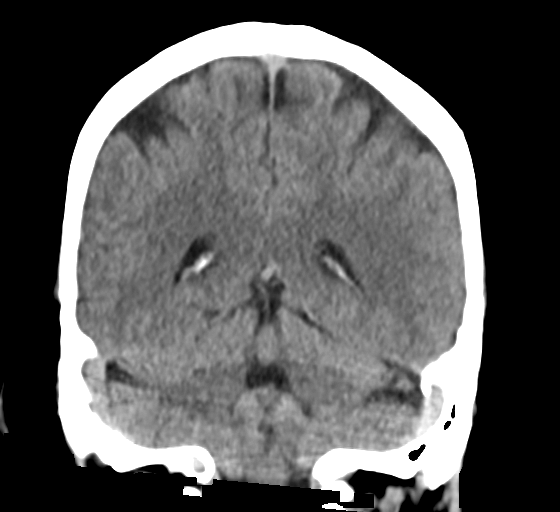
[im 17/39  brain]
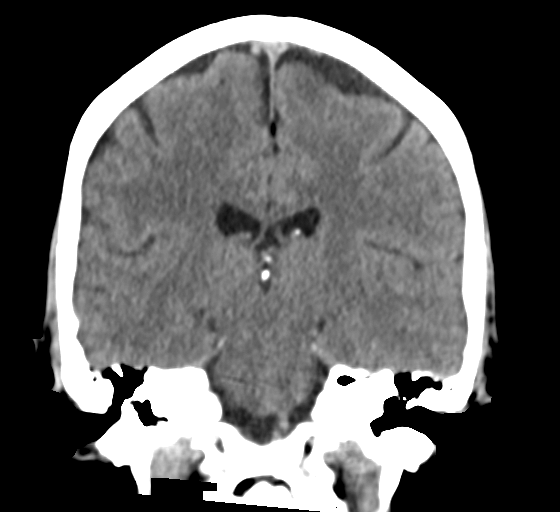
[im 22/39  brain]
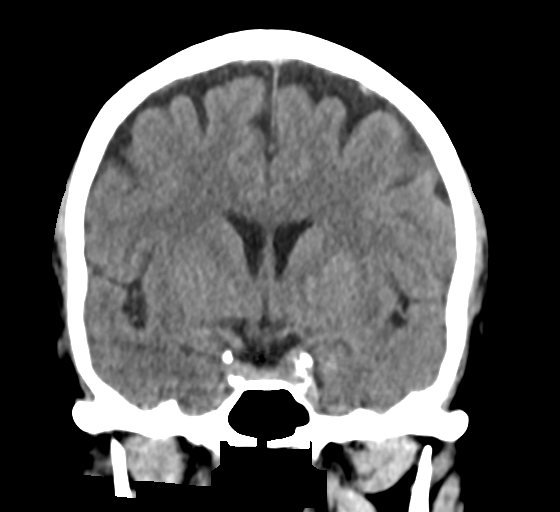

[Series 6: brain sag 5.00 hr40 s3 · sagittal · 0.30mm/px · 3 of 34 slices shown]
[im 12/34  brain]
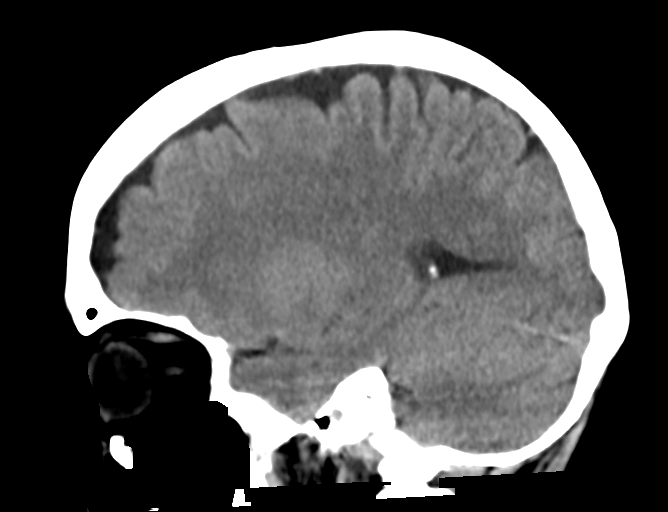
[im 17/34  brain]
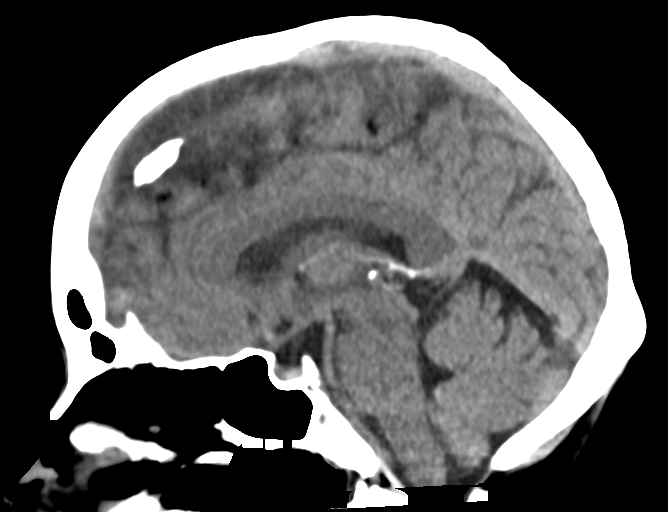
[im 23/34  brain]
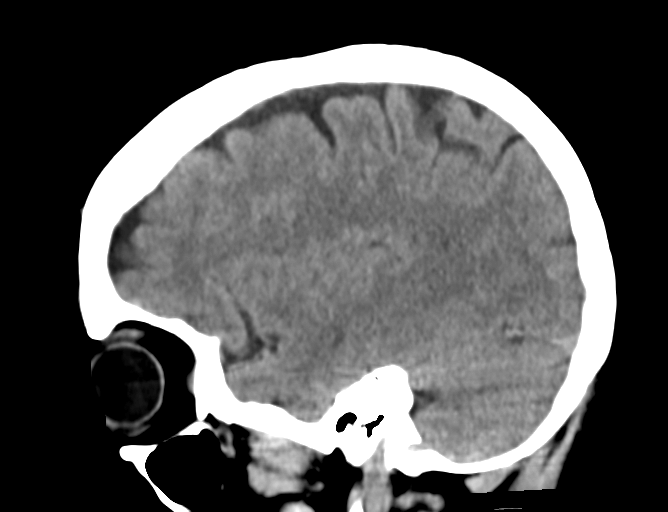

[Series 8: brain ax 2.00 hr60 s3 · axial · 0.33mm/px · z∈[-530,-408]mm · 8 of 77 slices shown, 10 images]
[im 8/77  brain]
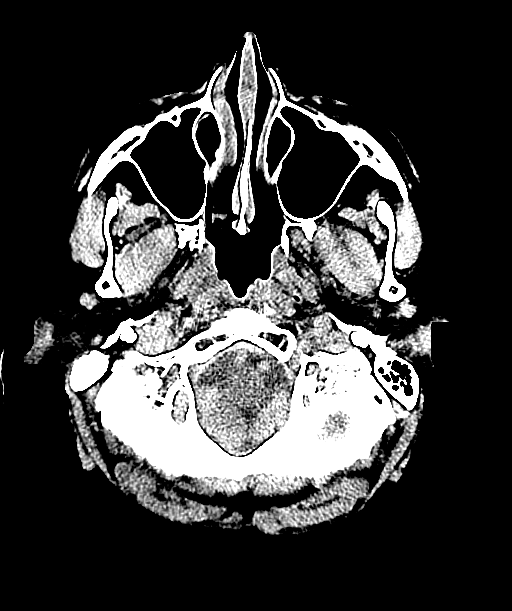
[im 8/77  bone]
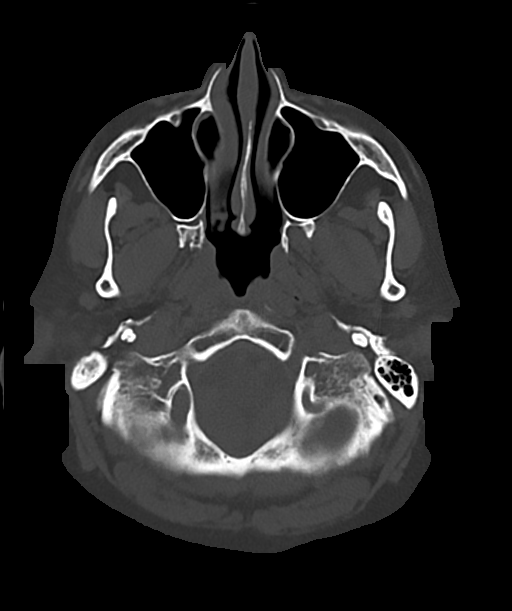
[im 16/77  brain]
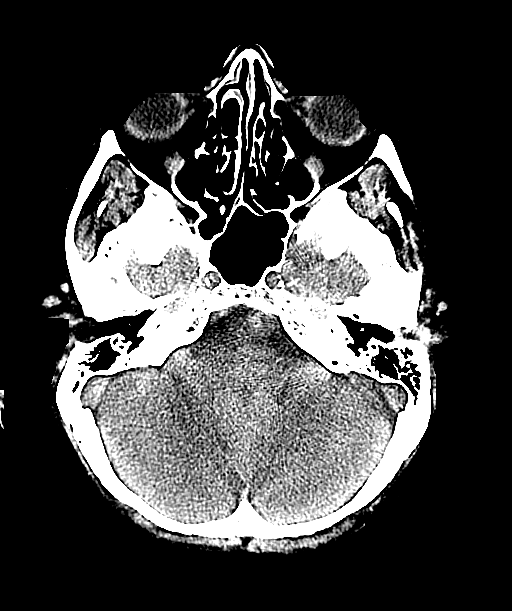
[im 23/77  brain]
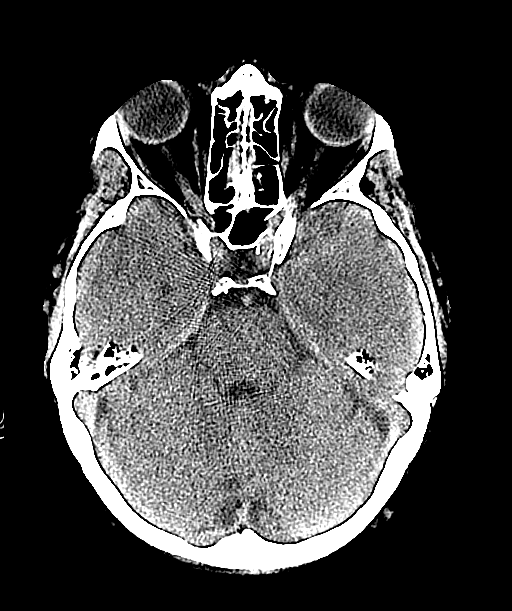
[im 35/77  brain]
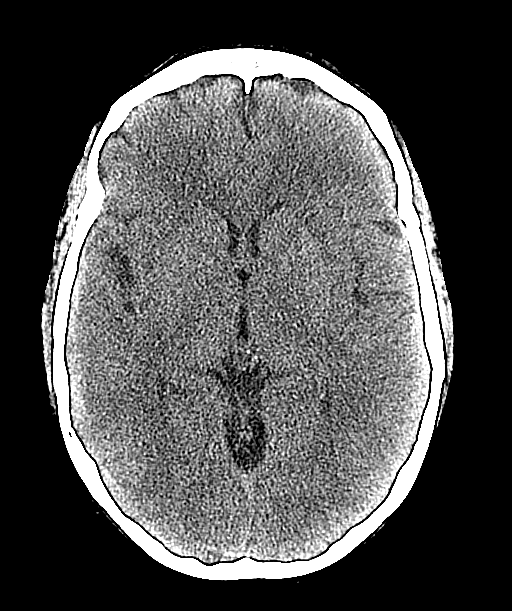
[im 42/77  brain]
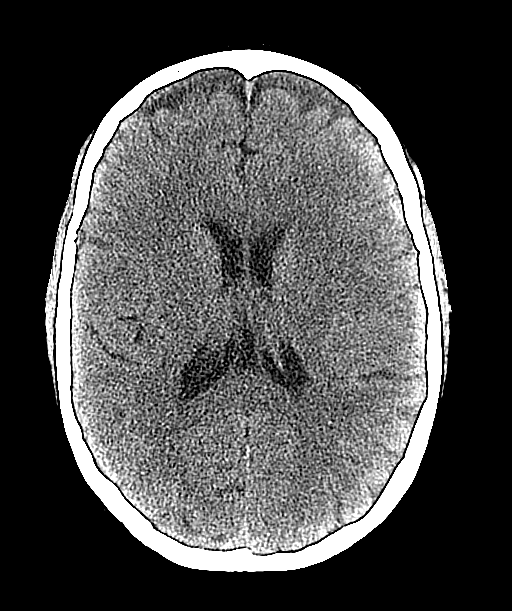
[im 42/77  bone]
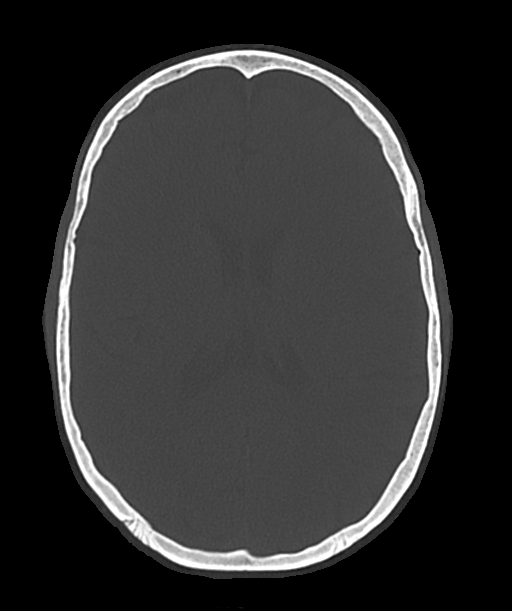
[im 54/77  brain]
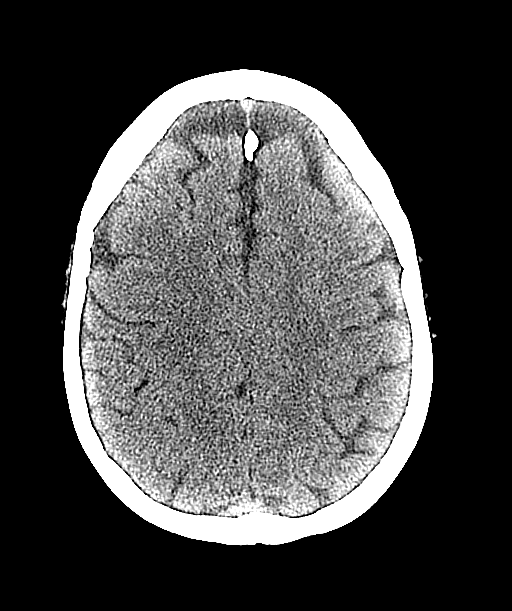
[im 61/77  brain]
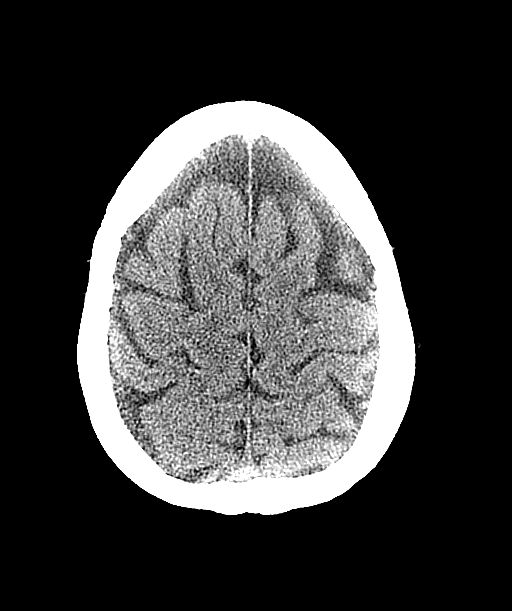
[im 69/77  brain]
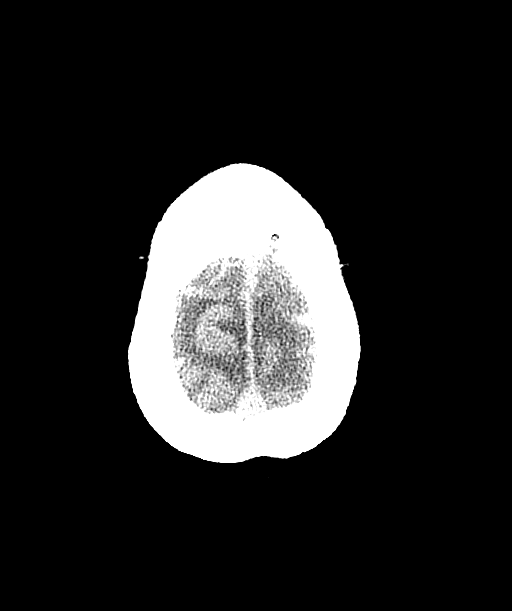

[14 of 47 positions shown; findings below may reference images not displayed]

DIAGNOSTIC STUDIES

EXAM

CT head/brain wo con

INDICATION

Headaches

TECHNIQUE

All CT scans at this facility use dose modulation, iterative reconstruction, and/or weight based
dosing when appropriate to reduce radiation dose to as low as reasonably achievable.

Number of previous computed tomography exams in the last 12 months is 2.

Number of previous nuclear medicine myocardial perfusion studies in the last 12 months is 0  .

COMPARISONS

None available

FINDINGS

No intracranial mass or hemorrhage is seen. Ventricular system is unremarkable. Bony calvarium is
normal. Calcification of the cavernous carotid arteries is evident.

IMPRESSION

Negative CT scan of the head. Report called to Dr. Miyhear at [DATE] p.m..

Tech Notes:

## 2021-12-15 ENCOUNTER — Encounter: Admit: 2021-12-15 | Discharge: 2021-12-15 | Payer: MEDICARE

## 2021-12-15 MED ORDER — BUTALBITAL-ACETAMINOPHEN-CAFF 50-325-40 MG PO TAB
1 | ORAL_TABLET | ORAL | 0 refills | 5.00000 days | Status: AC | PRN
Start: 2021-12-15 — End: ?

## 2021-12-15 NOTE — Telephone Encounter
Fioricet script placed, per Dr. Roger Kill verbal order. Dr. Roger Kill to [place].

## 2021-12-16 ENCOUNTER — Encounter: Admit: 2021-12-16 | Discharge: 2021-12-16 | Payer: MEDICARE

## 2021-12-16 MED ORDER — CLONAZEPAM 1 MG PO TAB
1 mg | ORAL_TABLET | Freq: Three times a day (TID) | ORAL | 3 refills | Status: AC
Start: 2021-12-16 — End: ?

## 2021-12-16 NOTE — Telephone Encounter
Clonazepam script change pharmacy request. [facility] is currently out of medication and Phyllis is due for refill. New script placed to [facility] pharmacy, Dr. Roger Kill to [place].    LTHV: 09/14/2021

## 2022-02-09 ENCOUNTER — Encounter: Admit: 2022-02-09 | Discharge: 2022-02-09 | Payer: MEDICARE

## 2022-02-10 ENCOUNTER — Encounter: Admit: 2022-02-10 | Discharge: 2022-02-10 | Payer: MEDICARE

## 2022-02-11 ENCOUNTER — Encounter: Admit: 2022-02-11 | Discharge: 2022-02-11 | Payer: MEDICARE

## 2022-02-11 NOTE — Telephone Encounter
Discussed Molly [name]'s patient message regarding blue dots in her vision with Dr. Lowella Dell. Dr. Lowella Dell recommended Molly [name] cover one eye gently and see if the dots disappear.     Reached out to Molly [name] to discuss her blue dot vision concerns. She states she is not ready to go through any testing a this time. She has realized she recently put her body under extra stress with a recent trip, lab work and a current headache etc this past week. Molly [name] did gently cover one eye at a time and confirmed the vision blue dots stay. She is going to reach out to her optometrist to see about an appointment. She has declined any additional testing at this time. She will call back if her symptoms worsen or change.     Molly [name] states, "Please remind Dr. Lowella Dell that I am a bit squirly this time of year. I want to wait and see if this worsens before doing anything else."    Routed to Dr. Lowella Dell.

## 2022-03-28 ENCOUNTER — Encounter: Admit: 2022-03-28 | Discharge: 2022-03-28 | Payer: MEDICARE

## 2022-03-29 ENCOUNTER — Encounter: Admit: 2022-03-29 | Discharge: 2022-03-29 | Payer: MEDICARE

## 2022-03-29 ENCOUNTER — Ambulatory Visit: Admit: 2022-03-29 | Discharge: 2022-03-30 | Payer: MEDICARE

## 2022-03-29 DIAGNOSIS — G43819 Other migraine, intractable, without status migrainosus: Secondary | ICD-10-CM

## 2022-03-29 DIAGNOSIS — R519 Generalized headaches: Secondary | ICD-10-CM

## 2022-03-29 DIAGNOSIS — L409 Psoriasis, unspecified: Secondary | ICD-10-CM

## 2022-03-29 DIAGNOSIS — B1910 Unspecified viral hepatitis B without hepatic coma: Secondary | ICD-10-CM

## 2022-03-29 DIAGNOSIS — G35 Multiple sclerosis: Secondary | ICD-10-CM

## 2022-03-29 DIAGNOSIS — Z720 Tobacco use: Secondary | ICD-10-CM

## 2022-03-29 NOTE — Progress Notes
Date of Service: 03/29/22    Subjective:       Chief Complaint:   Chief Complaint   Patient presents with   ? Follow Up         Obtained patient's verbal consent to treat them and their agreement to [facility] financial policy and NPP via this telehealth visit.     Molly [name] is a 61 year old left-handed woman who is well-known to me.  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  I last evaluated her in May 2023.  She was diagnosed with MS many years ago.  Her symptoms have largely involved severe muscle cramping, numbness and tingling, primarily in the lower extremities, but to some degree in the upper extremities.  At times she has experienced weakness in her limbs as well.  Her symptoms are much worse when exposed to extreme heat or cold.  She also has severe associated fatigue, and mild imbalance.     From January 2014 until spring 2018 she was on Tecfidera 240 mg twice daily.  She tolerated this well, but continued to have progression of her disease.  She was started on Tysabri in June 2018 and remained on this until late 2019, but developed severe generalized burning sensation in her body during and after her infusions, increasing with each infusion, so we discontinued it.  We checked her for antibody development to Tysabri, but these were negative, so it was unlikely she was actually experiencing allergic reaction.      Molly [name] experienced a fairly severe exacerbation in November 2020 and was treated with Acthar gel at that time.  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  She saw Dr. Cindi Carbon, gastroenterologist, and was placed on Entecavir.  After discussion with Dr. Darrin Luis, we agreed that it was reasonable for her to go ahead and initiate Ocrevus.  In December 2020 she underwent her first series of Ocrevus.  Other than mild associated flushing with the infusions, she tolerated these well.  She developed a severe rash on her feet, initially thought she had shingles, so decided to stop the infusions.  As it turns out, this was actually a severe flare of her psoriasis.  She has been seeing Dr. Assunta Gambles, dermatologist as well as Dr. Laneta Simmers rheumatologist for psoriasis with associated psoriatic arthritis.  I understand that Dr. Claudean Severance felt she would be better served by a different rheumatologist given her complicated case.  She is interested in seeing [place] rheumatology.  She has been off methotrexate for the last 4 to 6 months.  Fortunately, her psoriasis is under better control, with only a small patch on her elbow.  She believes her ulcerative colitis has been stable.  She is now off Entecavir, under the approval of Dr. Darrin Luis.     Tizanidine and gabapentin as well as lamotrigine continue to be helpful for her pain and muscle spasm.  Clonazepam is taken as needed to help her sleep as well as for severe muscle spasm unrelieved by above.     Molly [name] also has history of subclavian steal syndrome and continues to be a lifelong smoker, unable to quit in the past.  She remains on clopidogrel 75 mg daily.  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 Harley Alto who did not feel it was worthwhile to revascularize the kidney.     Repeat MRIs of the brain and thoracic spine in September 2022  were completely unchanged from prior study.    She and her husband made a trip to [place] recently.  She did very well in the warm weather.         Medical History:   Diagnosis Date   ? Generalized headaches    ? Hepatitis B infection    ? Multiple sclerosis (HCC)    ? Psoriasis    ? Tobacco use 07/27/2021            Social Determinants of Health     Tobacco Use: Low Risk  (03/29/2022)    Patient History    ? Smoking Tobacco Use: Never    ? Smokeless Tobacco Use: Never    ? Passive Exposure: Not on file   Alcohol Use: Not on file (03/17/2021)   Financial Resource Strain: Not on file   Food Insecurity: Not on file Transportation Needs: Not on file   Stress: Not on file   Social Connections: Not on file   Health Literacy: Not on file   Depression: Not at risk (03/29/2022)    PHQ-2    ? PHQ-2 Score: 0   Housing Stability: Not on file         Family History   Problem Relation Age of Onset   ? Cancer Mother    ? Alcohol abuse Mother    ? Cancer-Pancreas Mother    ? Glaucoma Mother    ? Hypertension Father    ? Coronary Artery Disease Father    ? Diabetes Type II Father    ? Heart Attack Father    ? Migraines Father    ? Seizures Son    ? Alcohol abuse Maternal Grandmother    ? Hypertension Paternal Grandmother    ? Diabetes Type II Paternal Grandmother    ? Hypertension Paternal Grandfather    ? Heart Attack Paternal Grandfather    ? Diabetes Type II Paternal Grandfather    ? Coronary Artery Disease Paternal Grandfather    ? Kidney Disease Paternal Grandfather    ? Cancer Other    ? Cancer Maternal Aunt    ? Arthritis-rheumatoid Paternal Aunt        Review of Systems      Objective:         Allergies   Allergen Reactions   ? Atorvastatin MUSCLE PAIN   ? Adhesive Tape (Rosins) RASH   ? Adhesive UNKNOWN   ? Cefdinir DIARRHEA   ? Doxycycline NAUSEA AND VOMITING       ? amitriptyline (ELAVIL) 25 mg tablet Take one tablet by mouth at bedtime daily.   ? amLODIPine (NORVASC) 5 mg tablet Take one tablet by mouth twice daily.   ? butalbital-acetaminophen-caffeine (ESGIC) 50-325-40 mg tablet Take one tablet by mouth every 4 hours as needed for Headache.   ? calcipotriene (DOVONEX) 0.005 % crea Apply  topically to affected area twice daily.   ? clobetasoL (TEMOVATE) 0.05 % topical ointment Apply  topically to affected area twice daily.   ? clonazePAM (KLONOPIN) 1 mg tablet Take one tablet by mouth three times daily.   ? clonidine hcl (KAPVAY) 0.1 mg ER tablet Take one tablet by mouth as Needed.   ? clopiDOGrel (PLAVIX) 75 mg tablet Take one tablet by mouth daily.   ? diphenhydrAMINE-acetaminophen 25-500 mg tab tablet Take 500 tablets by mouth daily. Taking 1000 mg QHS   ? ezetimibe (ZETIA) 10 mg tablet Take one tablet by mouth daily.   ? folic acid (FOLVITE)  1 mg tablet Take one tablet by mouth daily.   ? gabapentin (NEURONTIN) 300 mg capsule Take three capsules by mouth three times daily.   ? levocetirizine (XYZAL) 5 mg tablet Take one tablet by mouth daily.   ? methotrexate sodium 2.5 mg tablet Take six tablets by mouth every 7 days.   ? olmesartan (BENICAR) 20 mg tablet Take one tablet by mouth twice daily.   ? sulfaSALAzine (AZULFIDINE) 500 mg tablet TAKE 2 TABLETS BY MOUTH EVERY 8 HOURS FOR 30 DAYS WITH FOOD (MEAL/SNACK)   ? tiZANidine (ZANAFLEX) 4 mg tablet Take two tablets by mouth three times daily. (Patient taking differently: Take 1.5 tablets by mouth at bedtime as needed.)     Vitals:    03/29/22 0747   PainSc: Eight   Weight: 83 kg (183 lb)   Height: 162.6 cm (5' 4)     Body mass index is 31.41 kg/m?Marland Kitchen     Physical Exam  Constitutional:       Appearance: Normal appearance.   HENT:      Head: Normocephalic and atraumatic.      Mouth/Throat:      Mouth: Mucous membranes are moist.   Eyes:      Extraocular Movements: Extraocular movements intact.      Pupils: Pupils are equal, round, and reactive to light.   Cardiovascular:      Rate and Rhythm: Normal rate and regular rhythm.   Pulmonary:      Effort: Pulmonary effort is normal. No respiratory distress.   Musculoskeletal:         General: No deformity. Normal range of motion.      Cervical back: Normal range of motion and neck supple.   Skin:     General: Skin is warm and dry.   Neurological:      Mental Status: She is alert.      Comments: Alert and oriented x4.  Recent and remote memory are intact.  Speech is normal.  Attention is excellent.  Cranial nerve examination reveals no facial asymmetry or decrease in facial sensation to the patient's touch.  Eye findings as discussed above.  Tongue and palate movements are normal.  Neck and shoulder strength appear intact.  Motor examination is without drift.  Strength is grossly normal throughout both upper as well as lower extremities proximally distally.    Reflexes and sensation could not be adequately assessed.  Coordination is excellent without evidence for dysmetria or ataxia in either of the upper lower extremities.  The patient was able to stand and ambulate without difficulty.   Psychiatric:         Mood and Affect: Mood normal.         Behavior: Behavior normal.     Office Procedure:           Assessment and Plan:    1. Multiple sclerosis (HCC)    2. Other migraine without status migrainosus, intractable    3. Psoriasis        Overall, Lonnie is stable neurologically.  She is off disease modifying therapy which I feel is fine for now.  Her MRIs have been stable as well.    Migraine headaches, overall reasonably well-controlled at present.  Continue as needed medications as above.    Psoriasis, severe, but reasonably well-controlled at present.  She would like to see [place] [facility]ology and we will go ahead and place referral for this.    I will see her again for follow-up in  6 months from now.    Total time today was 31 minutes in the following activities: preparing to see the patient, reviewing records, performing a medically appropriate examination, counseling and educating the patient, ordering tests, medications, and treatment, documenting critical information in the EHR, referring and communicating with other healthcare professionals (when not separately reported), and coordinating care.

## 2022-04-01 ENCOUNTER — Encounter: Admit: 2022-04-01 | Discharge: 2022-04-01 | Payer: MEDICARE

## 2022-04-01 NOTE — Progress Notes
Records Request    Medical records request for continuation of care:    Molly [name], Molly [name]   DOB:  June 20, 1960    Patient has appointment on 04/12/2022   with  Dr. Nickolas Madrid* .      **Please fax records to [facility] [phone]      Request records: STAT            Holter Monitor Report AND EKG/TELEMETRY STRIPS (November 2023)                    Thank you,      Cardiovascular Medicine  [facility]  [address]  [place], [place] [address]  Phone:  [phone]  Fax:  [phone]

## 2022-04-12 ENCOUNTER — Encounter: Admit: 2022-04-12 | Discharge: 2022-04-12 | Payer: MEDICARE

## 2022-04-12 ENCOUNTER — Ambulatory Visit: Admit: 2022-04-12 | Discharge: 2022-04-12 | Payer: MEDICARE

## 2022-04-12 DIAGNOSIS — Z9229 Personal history of other drug therapy: Secondary | ICD-10-CM

## 2022-04-12 DIAGNOSIS — E7800 Pure hypercholesterolemia, unspecified: Secondary | ICD-10-CM

## 2022-04-12 DIAGNOSIS — R42 Dizziness and giddiness: Secondary | ICD-10-CM

## 2022-04-12 DIAGNOSIS — Z720 Tobacco use: Secondary | ICD-10-CM

## 2022-04-12 DIAGNOSIS — I739 Peripheral vascular disease, unspecified: Secondary | ICD-10-CM

## 2022-04-12 DIAGNOSIS — G35 Multiple sclerosis: Secondary | ICD-10-CM

## 2022-04-12 DIAGNOSIS — L4050 Arthropathic psoriasis, unspecified: Secondary | ICD-10-CM

## 2022-04-12 DIAGNOSIS — E781 Pure hyperglyceridemia: Secondary | ICD-10-CM

## 2022-04-12 DIAGNOSIS — I10 Essential (primary) hypertension: Secondary | ICD-10-CM

## 2022-04-12 DIAGNOSIS — R079 Chest pain, unspecified: Secondary | ICD-10-CM

## 2022-04-12 DIAGNOSIS — Z780 Asymptomatic menopausal state: Secondary | ICD-10-CM

## 2022-04-12 DIAGNOSIS — G43819 Other migraine, intractable, without status migrainosus: Secondary | ICD-10-CM

## 2022-04-12 DIAGNOSIS — G458 Other transient cerebral ischemic attacks and related syndromes: Secondary | ICD-10-CM

## 2022-04-12 DIAGNOSIS — E785 Hyperlipidemia, unspecified: Secondary | ICD-10-CM

## 2022-04-12 DIAGNOSIS — B181 Chronic viral hepatitis B without delta-agent: Secondary | ICD-10-CM

## 2022-04-12 DIAGNOSIS — B1910 Unspecified viral hepatitis B without hepatic coma: Secondary | ICD-10-CM

## 2022-04-12 DIAGNOSIS — R002 Palpitations: Secondary | ICD-10-CM

## 2022-04-12 DIAGNOSIS — E669 Obesity, unspecified: Secondary | ICD-10-CM

## 2022-04-12 DIAGNOSIS — I701 Atherosclerosis of renal artery: Secondary | ICD-10-CM

## 2022-04-12 DIAGNOSIS — L409 Psoriasis, unspecified: Secondary | ICD-10-CM

## 2022-04-12 DIAGNOSIS — D539 Nutritional anemia, unspecified: Secondary | ICD-10-CM

## 2022-04-12 DIAGNOSIS — R0989 Other specified symptoms and signs involving the circulatory and respiratory systems: Secondary | ICD-10-CM

## 2022-04-12 DIAGNOSIS — R519 Generalized headaches: Secondary | ICD-10-CM

## 2022-04-12 DIAGNOSIS — M1990 Unspecified osteoarthritis, unspecified site: Secondary | ICD-10-CM

## 2022-04-12 NOTE — Progress Notes
Date of Service: 04/12/2022    Molly [name] is a 61 y.o. female.       HPI      Molly [name] is a 61 y.o. white female is a 61 year old female with a history of primary hypertension, history of hypotension that in the past required hospitalization, history of DVT of the left upper extremity in 2011, subclavian steal syndrome, status post angioplasty of the left subclavian and brachial artery with stenting in December 2011, multiple sclerosis, fibromyalgia, psoriasis, psoriatic arthritis, renal artery stenosis, tobacco use, ulcerative colitis.    Patient reports experiencing episodes of chest tightness and heart palpitations.    In February 2020 she was evaluated with the following:    1.  Regadenoson MPI on 06/14/2021-no ischemia, LVEF = 61%  2.  A 2D echo Doppler study dated 06/07/2021-normal LVEF = 65%, unremarkable valvular structures    Previous evaluation with a renal ultrasound demonstrated left renal artery stenosis, patient was also evaluated with a CT angiogram.    She was seen in the office by Dr. Chales Abrahams on 08/03/2021, he recommended continued observation and medical treatment of her blood pressure, did not recommend intervention.    Patient does report having heart palpitations, does occur with and without physical activity.  She continues to smoke cigarettes.  She was smoking before pack of cigarettes a day, currently smokes half pack cigarettes a day.         Vitals:    04/12/22 1424   BP: 126/86   BP Source: Arm, Left Upper   Pulse: 92   SpO2: 98%   O2 Device: None (Room air)   PainSc: Eight   Weight: 83.8 kg (184 lb 12.8 oz)   Height: 162.6 cm (5' 4)     Body mass index is 31.72 kg/m?Marland Kitchen     Past Medical History  Patient Active Problem List    Diagnosis Date Noted    Left renal artery stenosis (HCC) 08/03/2021    Tobacco use 07/27/2021    On methotrexate therapy 07/27/2021    Back pain 07/26/2021    Migraines 07/26/2021    Psoriatic arthritis (HCC) 07/26/2021    Subclavian steal syndrome 07/26/2021    Renal artery stenosis [facility]) 07/26/2021     06/07/2021- Renal Artery Dopper ([facility]): Findings concerning for left renal artery stenosis.   06/11/21- [facility]- CT ABD& Pelvis with contrast- No significant stenosis within the left renal artery. Mildly atrophic right kidney with mildly decreased perfusion. Diminutive right renal artery with a focal high-grade stenosis at the origin.       Fibromyalgia 07/26/2021    Hypertension 07/26/2021     -06/07/21- [facility]- Echo- LV size is normal LVEF 65%. No doppler evidence of hemodynamically significant valvular disease. No pericardial effusion.   - 06/17/21- [facility]- Stress MPI- The study is normal with no evidence of myocardial ischemia. LV systolic function is normal. No high risk prognostic indicators present.   - 06/07/21- [facility]- Renal Duplex- Findings concerning for left renal artery stenosis.       Multiple sclerosis (HCC) 01/21/2021    Anxiety 01/21/2021    Hepatitis B infection without delta agent without hepatic coma 01/21/2021    Psoriasis 01/21/2021    Peripheral vascular disease (HCC) 05/25/2010     Formatting of this note might be different from the original.  ICD-10 conversion      Hyperlipidemia 09/30/2009     Formatting of this note might be different from the original.  ICD10  Major depressive disorder, single episode 09/30/2009     Formatting of this note might be different from the original.  ICD-10 conversion      Palpitations 09/07/2009         Review of Systems   Constitutional: Positive for malaise/fatigue and weight gain.   HENT: Negative.     Eyes: Negative.    Cardiovascular:  Positive for chest pain, irregular heartbeat, leg swelling and palpitations.   Respiratory: Negative.     Endocrine: Negative.    Hematologic/Lymphatic: Negative.    Skin: Negative.    Musculoskeletal:  Positive for arthritis, back pain, falls, joint pain, muscle cramps, muscle weakness and myalgias.   Gastrointestinal:         Ulcerative colitis   Genitourinary: Negative.    Neurological:  Positive for dizziness, headaches, light-headedness, numbness, paresthesias and weakness.   Psychiatric/Behavioral: Negative.     Allergic/Immunologic: Negative.        Physical Exam  General Appearance: normal in appearance  Skin: warm, moist, no ulcers or xanthomas  Eyes: conjunctivae and lids normal, pupils are equal and round  Lips & Oral Mucosa: no pallor or cyanosis  Neck Veins: neck veins are flat, neck veins are not distended  Chest Inspection: chest is normal in appearance  Respiratory Effort: breathing comfortably, no respiratory distress  Auscultation/Percussion: lungs clear to auscultation, no rales or rhonchi, no wheezing  Cardiac Rhythm: regular rhythm and normal rate  Cardiac Auscultation: S1, S2 normal, no rub, no gallop  Murmurs: 2/6 systolic murmur @ RUSB  Carotid Arteries: normal carotid upstroke bilaterally, no bruit  Lower Extremity Edema: no lower extremity edema  Abdominal Exam: soft, non-tender, no masses, bowel sounds normal  Liver & Spleen: no organomegaly  Language and Memory: patient responsive and seems to comprehend information  Neurologic Exam: neurological assessment grossly intact      Cardiovascular Studies  Twelve-lead EKG demonstrates normal sinus rhythm, ventricular rate 91 bpm, no axis deviation, no ST segment T wave changes.    Cardiovascular Health Factors  Vitals BP Readings from Last 3 Encounters:   04/12/22 126/86   08/03/21 129/78   07/27/21 118/68     Wt Readings from Last 3 Encounters:   04/12/22 83.8 kg (184 lb 12.8 oz)   03/29/22 83 kg (183 lb)   09/14/21 79.4 kg (175 lb)     BMI Readings from Last 3 Encounters:   04/12/22 31.72 kg/m?   03/29/22 31.41 kg/m?   09/14/21 30.04 kg/m?      Smoking Social History     Tobacco Use   Smoking Status Every Day    Packs/day: 0.50    Years: 15.00    Additional pack years: 0.00    Total pack years: 7.50    Types: Cigarettes   Smokeless Tobacco Never      Lipid Profile Cholesterol   Date Value Ref Range Status   08/12/2021 231 (H) <200 Final     HDL   Date Value Ref Range Status   08/12/2021 44  Final     LDL   Date Value Ref Range Status   08/12/2021 145 (H) <100 Final     Triglycerides   Date Value Ref Range Status   08/12/2021 212 (H) <150 Final      Blood Sugar No results found for: HGBA1C  Glucose   Date Value Ref Range Status   03/21/2022 90  Final          Problems Addressed Today  Encounter Diagnoses  Name Primary?    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  Chest pain-atypical  Patient was evaluated with a perfusion imaging study in February 2023, she was not found to have any ischemia  2.  PAD-no current symptoms  3.  History of left subclavian steal syndrome, history of angioplasty of the left subclavian and brachial artery in December 2011-patient continues on clopidogrel  4.  Right renal artery stenosis, atretic right kidney-she was evaluated and continued observation and medical management of her blood pressure was recommended  5.  Primary hypertension-well-controlled, on several antihypertensive drug therapy  6.  Continued tobacco use-currently patientt smokes half pack of cigarettes a day  7.  Several autoimmune disease that consist of:  Multiple sclerosis  Psoriatic arthritis  Ulcerative colitis  Fibromyalgia  Patient was on methotrexate, currently she is not on any any antiimmune drug therapy  8.  Family history of premature CAD-patient's father did have an MI before age 69  83.  Abdominal aortic calcifications    Plan:    1.  Continue all current medications  2.  I urged the patient to discontinue any use of tobacco products  3.  Further evaluation with a 14 days Holter monitor in the light of symptomatic heart palpitations-we will follow-up on the results of the test and will make further recommendations.    Total Time Today was 40 minutes in the following activities: Preparing to see the patient, Obtaining and/or reviewing separately obtained history, Performing a medically appropriate examination and/or evaluation, Counseling and educating the patient/family/caregiver, Ordering medications, tests, or procedures, Referring and communication with other health care professionals (when not separately reported), Documenting clinical information in the electronic or other health record, Independently interpreting results (not separately reported) and communicating results to the patient/family/caregiver, and Care coordination (not separately reported)          Current Medications (including today's revisions)   amLODIPine (NORVASC) 5 mg tablet Take one tablet by mouth twice daily.    butalbital-acetaminophen-caffeine (ESGIC) 50-325-40 mg tablet Take one tablet by mouth every 4 hours as needed for Headache.    calcipotriene (DOVONEX) 0.005 % crea Apply  topically to affected area twice daily.    clobetasoL (TEMOVATE) 0.05 % topical ointment Apply  topically to affected area twice daily.    clonazePAM (KLONOPIN) 1 mg tablet Take one tablet by mouth three times daily.    clonidine hcl (KAPVAY) 0.1 mg ER tablet Take one tablet by mouth as Needed.    clopiDOGrel (PLAVIX) 75 mg tablet Take one tablet by mouth daily.    diphenhydrAMINE-acetaminophen 25-500 mg tab tablet Take 500 tablets by mouth daily. Taking 1000 mg QHS    folic acid (FOLVITE) 1 mg tablet Take one tablet by mouth daily.    gabapentin (NEURONTIN) 300 mg capsule Take three capsules by mouth three times daily.    levocetirizine (XYZAL) 5 mg tablet Take one tablet by mouth  daily.    olmesartan (BENICAR) 20 mg tablet Take one tablet by mouth twice daily.    tiZANidine (ZANAFLEX) 4 mg tablet Take two tablets by mouth three times daily. (Patient taking differently: Take 1.5 tablets by mouth at bedtime as needed.)    traMADoL (ULTRAM) 50 mg tablet Take one tablet by mouth as Needed for Pain.

## 2022-04-12 NOTE — Progress Notes
To our valued patient,     We have enrolled your heart monitor and requested it be sent to your home.  You should receive this within 2-3 business days. Please wear the monitor for 14 days. When you have completed the study, please remove the device, and mail it back to the company. Please call [employer] Customer Service at [phone] with questions about placement, troubleshooting, and insurance coverage. You can reach the [facility] ambulatory heart monitor team at [phone].      Your Heart Rhythm Management Team  Cardiovascular Medicine Department at [facility]              Ambulatory (External) Cardiac Monitor Enrollment Record     Placement Location: Home Enrollment  Vendor: [employer] ([employer])  Mobile Cardiac Telemetry (MCOT/MCT)?: No  Duration of Monitor (in days): 14  Monitor Diagnosis: Other (Other specified s/s involving the circ. & resp. systems - R09.89)  No data recordedOrdering Provider: Nickolas Madrid  AMB Monitor Serial Number: Home  No data recorded    Start Time and Date: 04/12/22 3:20 PM   Patient Name: Molly [name]  DOB: 03/24/1961 02/10/62  MRN: [id number]  Sex: female  Mobile Phone Number: [phone] (mobile)  Home Phone Number: [phone]  Patient Address: [address] [place] [address]  Insurance Coverage: MEDICARE PART A AND B  Insurance ID: [id number]  Insurance Group #:   Insurance Subscriber: [name]  Implanted Cardiac Device Information: No results found for: [email or web address]      Patient instructed to contact company phone number on the monitor box with questions regarding billing, placement, troubleshooting.     [name]    ____________________________________________________________    Clinic Staff:    Complete additional steps for documentation double check/Co-Sign.  In Follow-up, send chart upon closing encounter to [facility] HR[facility] AMBULATORY MONITORS    [facility] Ambulatory Monitoring Team:  Schedule on appropriate template and check-in.   Clinic Placement Schedule on clinic location [facility] schedule   Home Enrollment Schedule on Home Enrollment schedule ([facility])   Given to patient in clinic for self-placement Schedule on Home Enrollment schedule ([facility]THM)   Inpatient Schedule on [place] [facility] AMBULATORY MONITORING template   2. Please enroll with appropriate vendor.

## 2022-04-12 NOTE — Patient Instructions
Thank you for visiting our office today.    We would like to make the following medication adjustments:         Otherwise continue the same medications as you have been doing.          We will be pursuing the following tests after your appointment today:       Orders Placed This Encounter    ECG 12-LEAD    LONG-TERM CARDIAC MONITOR         Please call us in the meantime with any questions or concerns.        Please allow 5-7 business days for our providers to review your results. All normal results will go to MyChart. If you do not have Mychart, it is strongly recommended to get this so you can easily view all your results. If you do not have mychart, we will attempt to call you once with normal lab and testing results. If we cannot reach you by phone with normal results, we will send you a letter.  If you have not heard the results of your testing after one week please give [place] a call.       Your Cardiovascular Medicine [place] [facility] Team Brett Canales, Pilar Jarvis, Shawna Orleans, and [place])  phone number is [phone].

## 2022-05-02 ENCOUNTER — Encounter: Admit: 2022-05-02 | Discharge: 2022-05-02 | Payer: MEDICARE

## 2022-05-02 MED ORDER — OLMESARTAN 20 MG PO TAB
20 mg | ORAL_TABLET | Freq: Two times a day (BID) | ORAL | 0 refills
Start: 2022-05-02 — End: ?

## 2022-05-12 ENCOUNTER — Encounter: Admit: 2022-05-12 | Discharge: 2022-05-12 | Payer: MEDICARE

## 2022-05-24 ENCOUNTER — Encounter: Admit: 2022-05-24 | Discharge: 2022-05-24 | Payer: MEDICARE

## 2022-06-06 ENCOUNTER — Encounter: Admit: 2022-06-06 | Discharge: 2022-06-06 | Payer: MEDICARE

## 2022-06-06 NOTE — Telephone Encounter
-----   Message from Vertell Novak, MD sent at 06/04/2022  2:07 PM CST -----  Please call the patient and let her know that the 14 days Holter monitor did not demonstrate any significant rhythm abnormalities, overall low risk.    Thank you      ----- Message -----  From: Vertell Novak, MD  Sent: 06/04/2022   2:01 PM CST  To: Vertell Novak, MD

## 2022-06-06 NOTE — Telephone Encounter
Sent mychart message with normal results.

## 2022-06-26 ENCOUNTER — Encounter: Admit: 2022-06-26 | Discharge: 2022-06-26 | Payer: MEDICARE

## 2022-07-04 ENCOUNTER — Encounter: Admit: 2022-07-04 | Discharge: 2022-07-04 | Payer: MEDICARE

## 2022-07-04 MED ORDER — OLMESARTAN 20 MG PO TAB
20 mg | ORAL_TABLET | Freq: Two times a day (BID) | ORAL | 0 refills | 90.00000 days | Status: AC
Start: 2022-07-04 — End: ?

## 2022-08-13 ENCOUNTER — Encounter: Admit: 2022-08-13 | Discharge: 2022-08-13 | Payer: MEDICARE

## 2022-08-13 MED ORDER — OLMESARTAN 20 MG PO TAB
20 mg | ORAL_TABLET | Freq: Two times a day (BID) | ORAL | 0 refills
Start: 2022-08-13 — End: ?

## 2022-08-15 ENCOUNTER — Encounter: Admit: 2022-08-15 | Discharge: 2022-08-15 | Payer: MEDICARE

## 2022-08-15 NOTE — Progress Notes
Medical records request for continuation of care:    Please fax records to [facility] [phone]    Request records:    Recent Labs      Please fax to :  Fax [phone]     [facility] -[facility]                                                                     [facility] [phone] - [place] [address]   Phone [phone] - Fax [phone]     **This is a note sent to your PCP or other healthcare provider to request records for continuation of care. NO ACTION REQUIRED FROM PATIENT.**

## 2022-09-13 ENCOUNTER — Encounter: Admit: 2022-09-13 | Discharge: 2022-09-13 | Payer: MEDICARE

## 2022-09-13 MED ORDER — CLONAZEPAM 1 MG PO TAB
1 mg | ORAL_TABLET | Freq: Three times a day (TID) | ORAL | 3 refills | Status: AC
Start: 2022-09-13 — End: ?

## 2022-09-13 NOTE — Telephone Encounter
Received clonazepam refill request for Molly [name]. LOV 03/29/2022, medication included in plan of care set forth by Dr. Roger Kill. Last refill script requested 12/16/2021. Request pended to Dr. Roger Kill for approval/denial.

## 2022-09-14 ENCOUNTER — Encounter: Admit: 2022-09-14 | Discharge: 2022-09-14 | Payer: MEDICARE

## 2022-09-14 NOTE — Progress Notes
Molly [name] is a 62 y.o. female who was referred by Ozzie Hoyle, MD (primary care provider) for opinion regarding psoriatic arthritis.    CHIEF COMPLAINT: polyarthralgia       HISTORY OF PRESENT ILLNESS:     She has had psoriasis since she was a child. She is not currently seeing a Dermatologist.    She states she was having joint pain before 2020 (cannot recall exactly when it started) however got worse with ocrelizumab infusion in 2020. Affected joints are the knees and ankles, worse in the evening and with activity. She had bilateral knee swelling (last time 06/2022) with redness and warmth. Sometimes ankles. She  She has never had arthrocentesis. She has EMS of 30 mins to 1 hour. She reports dactylitis in her fingers and toes sometimes. She has severe bilateral achilles tendinopathy.    She reports a diagnosis of colitis. She follows with a GI specialist (private practice). She reports she had colonoscopy. She is being monitored at this time. She reports she was having bloody stools. Not currently having symptoms. Diagnosed around 2022.    Back pain - all the time, with stiffness until she can take Advil, sometimes wakes her up at night. She has had this since 2-3 months ago.     Denies inflammatory eye disaese    Patient was evaluated by Dr. Laneta Simmers (rheumatologist at [facility]) in 09/2021.  Seen once per epic documentation.  Reportedly diagnosed with ulcerative colitis.  Reportedly was on ixekizumab and methotrexate which were stopped and was started on sulfasalazine 2 weeks prior to that visit due to kidney issues.  No evidence of synovitis although she had tenderness to her Achilles tendons.  Recommended she transferred her care to [facility].    Follows with Dr. Avie Arenas and Dr. 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    Follows with Dr. Roger Kill [place] neurology for multiple sclerosis.  Overall neurologically stable. Had allergic reactions to some treatment    He had Ocrevus-related worsening of her psoriasis     Reports a diagnosis of myofascial pain syndrome diagnosed by Dr Roger Kill    She reports she was on Taltz for 6-8 months by Dr Milus Banister (Dermatologist) [phone] for her Ocrevus-worsened psoriasis - patient didn't injections    Methotrexate maybe 12.5 or 15 mg once weekly, PCP stopped ~2023 as Pso was better    Patient states MTX was helpful for the skin - both were not helpful for the joints    She took sulfasalazine in 2022 for colitis - good for her joints but made her cause diarrhea    PMH: As below    PSH: As below    FH: no autoimmune disease in the immediate family members    SH: smokes 0.5-1 ppd x since 62 years old, EtOH socially. Smokes CBD every now and then.     Allergies    Review of outside records  Records from 2021 from [place] [facility] [employer] (Ozzie Hoyle, MD).  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  Patient seeing a dermatologist outside [place].  She is on topicals.  She is on gabapentin at 100 mg p.o. 3 times daily for fibromyalgia per primary care provider.  Patient was started on methotrexate at that visit.       REVIEW OF SYSTEMS:  Review of Systems  Constitutional:  Positive for fatigue.   Respiratory:  Positive for cough.    Cardiovascular:  Positive for leg  swelling.   Gastrointestinal:  Positive for blood in stool, constipation, diarrhea, nausea and vomiting.   Endocrine: Positive for cold intolerance and heat intolerance.   Musculoskeletal:  Positive for arthralgias, back pain, gait problem, joint swelling, myalgias, neck pain and neck stiffness.   Neurological:  Positive for weakness and headaches.   Psychiatric/Behavioral:  The patient is nervous/anxious.    All other systems reviewed and are negative.     PAST MEDICAL HISTORY:  Past Medical History:   Diagnosis Date    Arthritis     Chest pain     Dizziness     Generalized headaches     Hepatitis B infection     High cholesterol     HTN (hypertension)     HX: anticoagulation     Hyperlipemia     Hypertriglyceridemia     Multiple sclerosis (HCC)     Obesity     Postmenopausal     Psoriasis     Tobacco use 07/27/2021    Unspecified deficiency anemia 1968    I was anemic as a child       PAST SURGICAL HISTORY:  Surgical History:   Procedure Laterality Date    CESAREAN SECTION      1982 & 1984    EYE SURGERY Bilateral     1981       SOCIAL HISTORY:  Social History     Tobacco Use    Smoking status: Every Day     Current packs/day: 0.50     Average packs/day: 0.5 packs/day for 15.0 years (7.5 ttl pk-yrs)     Types: Cigarettes    Smokeless tobacco: Never   Substance Use Topics    Alcohol use: Yes     Comment: I rarely drink       FAMILY HISTORY:  Family History   Problem Relation Name Age of Onset    Cancer Mother Okey Regal         Pancreatic cancer alcoholic    Alcohol abuse Mother Greggory Brandy Mother Okey Regal     Glaucoma Mother Okey Regal     Hypertension Father Leighton Parody maker heart attack age 53    Coronary Artery Disease Father Onalee Hua     Diabetes Type II Father Onalee Hua     Heart Attack Father Onalee Hua     Migraines Father Onalee Hua     Seizures Son      Alcohol abuse Maternal Grandmother      Hypertension Paternal Grandmother Myrtis Ser     Diabetes Type II Paternal Grandmother Myrtis Ser     Diabetes Paternal Grandmother Myrtis Ser     Hypertension Paternal Addison Bailey         Renal artery disease    Heart Attack Paternal Addison Bailey     Diabetes Type II Paternal Addison Bailey     Coronary Artery Disease Paternal Addison Bailey     Kidney Disease Paternal Grandfather Rayna Sexton     Diabetes Paternal Addison Bailey     Cancer Other Maternal Aunt     Cancer Maternal Aunt Sandy     Arthritis-rheumatoid Paternal Aunt         ALLERGIES:  Allergies   Allergen Reactions    Atorvastatin MUSCLE PAIN    Adhesive Tape (Rosins) RASH    Adhesive UNKNOWN    Bupropion Hcl AGITATION and PALPITATIONS    Cefdinir DIARRHEA    Doxycycline NAUSEA AND VOMITING    Ezetimibe  DIARRHEA Tricyclic Antidepressants And Tricyclic Compounds UNKNOWN       MEDICATIONS:   amLODIPine (NORVASC) 5 mg tablet Take one tablet by mouth twice daily.    butalbital-acetaminophen-caffeine (ESGIC) 50-325-40 mg tablet Take one tablet by mouth every 4 hours as needed for Headache.    calcipotriene (DOVONEX) 0.005 % crea Apply  topically to affected area twice daily.    clobetasoL (TEMOVATE) 0.05 % topical ointment Apply  topically to affected area twice daily.    clonazePAM (KLONOPIN) 1 mg tablet Take one tablet by mouth three times daily.    clonidine hcl (KAPVAY) 0.1 mg ER tablet Take one tablet by mouth as Needed.    clopiDOGrel (PLAVIX) 75 mg tablet Take one tablet by mouth daily.    diphenhydrAMINE-acetaminophen 25-500 mg tab tablet Take 500 tablets by mouth daily. Taking 1000 mg QHS    folic acid (FOLVITE) 1 mg tablet Take one tablet by mouth daily.    gabapentin (NEURONTIN) 300 mg capsule Take three capsules by mouth three times daily.    levocetirizine (XYZAL) 5 mg tablet Take one tablet by mouth daily.    olmesartan (BENICAR) 20 mg tablet Take 1 tablet by mouth twice daily    tiZANidine (ZANAFLEX) 4 mg tablet Take two tablets by mouth three times daily. (Patient taking differently: Take 1.5 tablets by mouth at bedtime as needed.)    traMADoL (ULTRAM) 50 mg tablet Take one tablet by mouth as Needed for Pain.           PHYSICAL EXAM:  Vitals:    09/21/22 1426   BP: (!) 164/86   Pulse: 82   Temp: 36.4 ?C (97.5 ?F)   Resp: 18   SpO2: 98%   PainSc: Eight   Weight: 83.5 kg (184 lb)   Height: 162.6 cm (5' 4)     body mass index is 31.58 kg/m?Marland Kitchen     Physical Exam  Vitals reviewed.   HENT:      Head: Normocephalic and atraumatic.      Right Ear: External ear normal.      Left Ear: External ear normal.      Mouth/Throat:      Pharynx: Oropharynx is clear.   Pulmonary:      Effort: Pulmonary effort is normal.   Abdominal:      Tenderness: There is no abdominal tenderness.   Musculoskeletal:         General: Swelling (trace infrapatellar effusions) and tenderness (left quadriceps tendon, bilateral achilles tendons) present.      Right lower leg: No edema.      Left lower leg: No edema.      Comments: ?equival FABERs test on the left  Anterior chest tenderness  Lateral and medial epicondyles not tender   Skin:     Findings: Rash (psoriasis on the elbows and posterior ears) present.      Comments: Has nail polish   Neurological:      Mental Status: She is alert.         Labs / Studies: Pertinent labs reviewed as per EMR.    HEM:   CBC w diff    Lab Results   Component Value Date/Time    WBC 9.56 03/21/2022 12:00 AM    RBC 4.62 03/21/2022 12:00 AM    HGB 13.6 03/21/2022 12:00 AM    HCT 41.7 03/21/2022 12:00 AM    MCV 90.3 03/21/2022 12:00 AM    MCH 29.4 03/21/2022 12:00 AM    MCHC  32.6 03/21/2022 12:00 AM    RDW 13.2 03/21/2022 12:00 AM    PLTCT 239 03/21/2022 12:00 AM    MPV 10.9 03/21/2022 12:00 AM    No results found for: NEUT, ANC, LYMA, ALC, MONA, AMC, EOSA, AEC, BASA, ABC         Chem:   Basic Metabolic Profile    Lab Results   Component Value Date/Time    NA 140 03/21/2022 12:00 AM    K 3.8 03/21/2022 12:00 AM    CA 9.9 03/21/2022 12:00 AM    CL 108 (H) 03/21/2022 12:00 AM    CO2 23.0 03/21/2022 12:00 AM    GAP 9 03/21/2022 12:00 AM    Lab Results   Component Value Date/Time    BUN 18.0 03/21/2022 12:00 AM    CR 1.39 (H) 03/21/2022 12:00 AM    GLU 90 03/21/2022 12:00 AM            LFT:  Hepatic Function    Lab Results   Component Value Date/Time    ALBUMIN 4.1 03/21/2022 12:00 AM    TOTPROT 7.7 03/21/2022 12:00 AM    ALKPHOS 127 03/21/2022 12:00 AM    Lab Results   Component Value Date/Time    AST 17 03/21/2022 12:00 AM    ALT 23 03/21/2022 12:00 AM    TOTBILI 0.50 03/21/2022 12:00 AM        ESR CRP   No results found for: ESR   Lab Results   Component Value Date/Time    CRP 0.53 (H) 02/10/2022 12:00 AM          Autoimmune serologies:  AUTOIMMUNE   [facility]12/05/2009  Component 03/26/2010 03/26/2010 03/26/2010 03/26/2010 01/22/2010           ANA Qualitative Negative -- -- -- --   LA/SSB Antibody -- Negative -- -- --   RO/SSA Antibody -- -- Negative -- --   Anti Cyclic Citrullinated Peptide -- -- -- 6 --   Rheumatoid Factor -- -- -- -- 6         IMAGING:     MRI T-spine without contrast 2022 no acute osseous findings, thoracic cord within normal limits, degenerative disc changes without significant neuroforaminal or spinal canal stenosis      Left hand x-ray 2011 no erosions    CTA left upper extremity 2011  Impression:   1. No evidence of focal stenosis or arterial occlusion.   2. Anatomic variant of a high origin of the left radial artery with a   dominant left ulnar artery and dominant digital supply from the   superficial palmar arch as described.   3. Patent, opacified digital arteries are present throughout.       Signed (Authenticated, Released) Date-Time: 01/14/2010 1000       Transcriptionist- Hipolito Bayley  M.D., Staff Radiologist   Dictated By- Hipolito Bayley  M.D., Staff Radiologist   Staff Physician- Hipolito Bayley  M.D., Staff Radiologist   Authenticated By- Hipolito Bayley  M.D., Staff Radiologist       MRI right lower extremity 2008  : The palpable abnormality likely represents a ganglion   arising from the dorsolateral aspect of the Lisfranc joint and could be   related to some osteoarthrosis.     There is also osteoarthrosis of the first metatarsophalangeal joint.     There is some tenosynovitis of the distal peroneus longus tendon.       Signed (Authenticated, Released)  Date-Time: 03/01/2007 1507       Transcriptionist- Phillip Heal  M.D., Staff Radiologist   Dictated By- Phillip Heal  M.D., Staff Radiologist   Staff Physician- Phillip Heal  M.D., Staff Radiologist   Authenticated By- Phillip Heal  M.D., Staff Radiologist     ASSESSMENT:  Polyarthralgia - consideration given to seronegative spondyloarthropathy given risk factors including inflammatory bowel disease (ulcerative colitis) and psoriasis  Shoulders, knees, ankles, toes worse with activity however associated with prolonged early morning stiffness, reported swelling of the knees and Achilles tendon pain  Enthesitis of the left patellar tendon insertion, bilateral Achilles tendon insertion  Tenosynovitis distal peroneus longus tendon right MRI lower extremity 2008  Inflammatory back pain  Psoriasis, currently not on treatment, currently not seeing a dermatologist, previously on methotrexate and Taltz  Ulcerative colitis, previously on sulfasalazine however did not tolerate, currently being monitored by outside GI Dr Darrin Luis  Myofascial pain syndrome  History of deep vein thrombosis 2011, left upper extremity  Multiple sclerosis - Follows with Dr. Roger Kill [place] neurology for multiple sclerosis.  Overall neurologically stable.  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 Avie Arenas and Dr. Harley Alto ([place] cardiologist)   Degenerative disease of the T-spine  Osteoarthritis first MTP    IMPRESSION:   Molly [name] is a 62 y.o. female     Patient presents today for evaluation of polyarthralgia.  She has psoriasis and ulcerative colitis.  Her psoriasis is not that bothersome, she has patches on the elbows and behind the ears, she is currently not on treatment and is not seeing a dermatologist.  She has ulcerative colitis and follows with Dr. Darrin Luis outside [place].  Currently being monitored off of treatment as she has currently no IBD symptoms.  She has a lot of features of inflammatory arthritis.  In the setting of both risk factors for seronegative spondyloarthropathy, consideration was given to this.  Other possible considerations were rheumatoid arthritis.  She has multiple sclerosis so would avoid anti-TNF's if she would be need to be put on therapy for seronegative spondyloarthropathy.    PLAN:   -discussed options  -patient would like things discussed with Dr Roger Kill if she will be started on any Rheumatologic treatment - would discuss with Dr Roger Kill regarding abatacept () #1, IL 23 inhibitors #2, JAK inhibitors #3, azathioprine and apremilast  -will avoid anti TNF with the patient's multiple sclerosis  -Check CBC with differential, CMP, ESR, CRP, uric acid  -Check RF and anti-CCP  -Check hepatitis B and C panel, T-SPOT.TB  -Check x-rays of the SI joints, L-spine, hands, ankles  -[place] in [place]  We we will look into the logistics regarding infusion centers locally once we have made a discussion with her neurologist about the appropriate treatment for her.    All of the patient's questions were answered until they had none. They voiced understanding and agreement with the plans.     Patient seen and discussed with Dr. Jeral Pinch.  Thank you for allowing [place] to participate in the care of this patient.   Marti Sleigh, MD  Rheumatology Fellow, PGY5  [facility]  Division of Rheumatology  [address]  [place], [place] [address]  Dictation was used to fulfill a portion of this documentation. Every effort was made to correct any grammatic errors or spelling however they may exist after extensive review. Please feel free to reach out to me for any questions or clarifications.    Orders Placed This Encounter    SI JOINTS MIN 3 VIEWS    HAND MIN 3 VIEWS BILATERAL    ANKLE MIN 3 VIEWS BILATERAL    FOOT COMP MIN 3 VIEWS BILAT    RHEUMATOID FACTOR (RF)  today    CCP IGG ANTIBODY today    SED RATE today    C REACTIVE PROTEIN (CRP) today    CBC AND DIFF  today    COMPREHENSIVE METABOLIC PANEL  today    HEPATITIS B CORE AB TOT (IGG+IGM) today    Hepatitis C Antibody W Reflex HCV PCR Quant Today    HEPATITIS B SURFACE AG today QUANTIFERON TB GOLD PLUS today    URIC ACID, today    THIOPURINE METHYLTRANSFERASE RBC today     Patient Instructions   Please get your laboratory tests and xrays done    Please have your GI provider fax your colitis records to [place]    If you have any questions, please call my nurse Despina Hidden RN at [phone]     Future Appointments   Date Time Provider Department Center   09/27/2022  8:00 AM Sydnee Levans, DO [facility]     Visit Disposition       Dispositions    Return in about 3 months (around 12/22/2022) for In-Person.

## 2022-09-21 ENCOUNTER — Ambulatory Visit: Admit: 2022-09-21 | Discharge: 2022-09-22 | Payer: MEDICARE

## 2022-09-21 ENCOUNTER — Encounter: Admit: 2022-09-21 | Discharge: 2022-09-21 | Payer: MEDICARE

## 2022-09-21 DIAGNOSIS — R079 Chest pain, unspecified: Secondary | ICD-10-CM

## 2022-09-21 DIAGNOSIS — Z720 Tobacco use: Secondary | ICD-10-CM

## 2022-09-21 DIAGNOSIS — L409 Psoriasis, unspecified: Secondary | ICD-10-CM

## 2022-09-21 DIAGNOSIS — B1910 Unspecified viral hepatitis B without hepatic coma: Secondary | ICD-10-CM

## 2022-09-21 DIAGNOSIS — E669 Obesity, unspecified: Secondary | ICD-10-CM

## 2022-09-21 DIAGNOSIS — E781 Pure hyperglyceridemia: Secondary | ICD-10-CM

## 2022-09-21 DIAGNOSIS — E785 Hyperlipidemia, unspecified: Secondary | ICD-10-CM

## 2022-09-21 DIAGNOSIS — G35 Multiple sclerosis: Secondary | ICD-10-CM

## 2022-09-21 DIAGNOSIS — D539 Nutritional anemia, unspecified: Secondary | ICD-10-CM

## 2022-09-21 DIAGNOSIS — E7800 Pure hypercholesterolemia, unspecified: Secondary | ICD-10-CM

## 2022-09-21 DIAGNOSIS — R42 Dizziness and giddiness: Secondary | ICD-10-CM

## 2022-09-21 DIAGNOSIS — Z79899 Other long term (current) drug therapy: Secondary | ICD-10-CM

## 2022-09-21 DIAGNOSIS — Z9229 Personal history of other drug therapy: Secondary | ICD-10-CM

## 2022-09-21 DIAGNOSIS — R519 Generalized headaches: Secondary | ICD-10-CM

## 2022-09-21 DIAGNOSIS — Z780 Asymptomatic menopausal state: Secondary | ICD-10-CM

## 2022-09-21 DIAGNOSIS — M1990 Unspecified osteoarthritis, unspecified site: Secondary | ICD-10-CM

## 2022-09-21 DIAGNOSIS — I10 Essential (primary) hypertension: Secondary | ICD-10-CM

## 2022-09-21 NOTE — Progress Notes
ATTESTATION    I personally performed the key portions of the E/M visit, discussed the case with the Rheumatology fellow, Dr. Vernelle Emerald, and concur with his documentation of the history, physical exam, assessment, and treatment plan unless otherwise noted.    Molly [name] is referred for further assessment of joint pain in the setting of ulcerative colitis, psoriasis, and multiple sclerosis.  The musculoskeletal pain is suggestive of an inflammatory etiology with inflammatory back pain and enthesitis.  Currently, the ulcerative colitis symptoms have been stable off therapy.    Will need to review with Dr. Roger Kill in light of her underlying multiple sclerosis diagnosis.  Abatacept potentially could be an option that would be anticipated to address psoriasis and psoriatic arthritis but not ulcerative colitis.  JAK like she is getting she is getting tooth pulled and she may have cadaveric right inhibitors could target psoriasis, psoriatic arthritis, and inflammatory bowel disease.  Apremilast could address psoriasis and psoriatic arthritis but could complicate diarrhea.  Interleukin-23 inhibitors would be anticipated to help psoriasis and ulcerative colitis.    Staff Name: Jeral Pinch, MD

## 2022-09-21 NOTE — Patient Instructions
Please get your laboratory tests and xrays done    Please have your GI provider fax your colitis records to [place]    If you have any questions, please call my nurse Despina Hidden RN at [phone]

## 2022-09-21 NOTE — Progress Notes
Review of Systems   Constitutional:  Positive for fatigue.   Respiratory:  Positive for cough.    Cardiovascular:  Positive for leg swelling.   Gastrointestinal:  Positive for blood in stool, constipation, diarrhea, nausea and vomiting.   Endocrine: Positive for cold intolerance and heat intolerance.   Musculoskeletal:  Positive for arthralgias, back pain, gait problem, joint swelling, myalgias, neck pain and neck stiffness.   Neurological:  Positive for weakness and headaches.   Psychiatric/Behavioral:  The patient is nervous/anxious.    All other systems reviewed and are negative.

## 2022-09-22 ENCOUNTER — Encounter: Admit: 2022-09-22 | Discharge: 2022-09-22 | Payer: MEDICARE

## 2022-09-22 DIAGNOSIS — G35 Multiple sclerosis: Secondary | ICD-10-CM

## 2022-09-22 DIAGNOSIS — L409 Psoriasis, unspecified: Secondary | ICD-10-CM

## 2022-09-22 DIAGNOSIS — L4050 Arthropathic psoriasis, unspecified: Secondary | ICD-10-CM

## 2022-09-22 NOTE — Telephone Encounter
Phone call to PCP in [place] for coordination of infusion therapy.  VM left on nurse line for coordination

## 2022-09-22 NOTE — Telephone Encounter
-----   Message from Peggye Form, MD sent at 09/22/2022  1:55 PM CDT -----  External Infusion has been ordered. Please see attached notes. Thank you!

## 2022-09-26 ENCOUNTER — Encounter: Admit: 2022-09-26 | Discharge: 2022-09-26 | Payer: MEDICARE

## 2022-09-26 NOTE — Telephone Encounter
Molly [name] with Dr. Carney Living office lvm asking for a return call. Called and lvm. Ph. [phone].

## 2022-09-27 ENCOUNTER — Ambulatory Visit: Admit: 2022-09-27 | Discharge: 2022-09-28 | Payer: MEDICARE

## 2022-09-27 ENCOUNTER — Encounter: Admit: 2022-09-27 | Discharge: 2022-09-27 | Payer: MEDICARE

## 2022-09-27 DIAGNOSIS — L409 Psoriasis, unspecified: Secondary | ICD-10-CM

## 2022-09-27 DIAGNOSIS — Z780 Asymptomatic menopausal state: Secondary | ICD-10-CM

## 2022-09-27 DIAGNOSIS — G43819 Other migraine, intractable, without status migrainosus: Secondary | ICD-10-CM

## 2022-09-27 DIAGNOSIS — R079 Chest pain, unspecified: Secondary | ICD-10-CM

## 2022-09-27 DIAGNOSIS — E781 Pure hyperglyceridemia: Secondary | ICD-10-CM

## 2022-09-27 DIAGNOSIS — D539 Nutritional anemia, unspecified: Secondary | ICD-10-CM

## 2022-09-27 DIAGNOSIS — R42 Dizziness and giddiness: Secondary | ICD-10-CM

## 2022-09-27 DIAGNOSIS — E7800 Pure hypercholesterolemia, unspecified: Secondary | ICD-10-CM

## 2022-09-27 DIAGNOSIS — G629 Polyneuropathy, unspecified: Secondary | ICD-10-CM

## 2022-09-27 DIAGNOSIS — I10 Essential (primary) hypertension: Secondary | ICD-10-CM

## 2022-09-27 DIAGNOSIS — R519 Generalized headaches: Secondary | ICD-10-CM

## 2022-09-27 DIAGNOSIS — E669 Obesity, unspecified: Secondary | ICD-10-CM

## 2022-09-27 DIAGNOSIS — B1910 Unspecified viral hepatitis B without hepatic coma: Secondary | ICD-10-CM

## 2022-09-27 DIAGNOSIS — M1990 Unspecified osteoarthritis, unspecified site: Secondary | ICD-10-CM

## 2022-09-27 DIAGNOSIS — Z9229 Personal history of other drug therapy: Secondary | ICD-10-CM

## 2022-09-27 DIAGNOSIS — G43909 Migraine, unspecified, not intractable, without status migrainosus: Secondary | ICD-10-CM

## 2022-09-27 DIAGNOSIS — E785 Hyperlipidemia, unspecified: Secondary | ICD-10-CM

## 2022-09-27 DIAGNOSIS — G35 Multiple sclerosis: Secondary | ICD-10-CM

## 2022-09-27 DIAGNOSIS — Z720 Tobacco use: Secondary | ICD-10-CM

## 2022-09-27 DIAGNOSIS — R002 Palpitations: Secondary | ICD-10-CM

## 2022-09-27 DIAGNOSIS — R1319 Other dysphagia: Secondary | ICD-10-CM

## 2022-09-27 NOTE — Progress Notes
Date of Service: 09/27/22    Subjective:       Chief Complaint:   Chief Complaint   Patient presents with    Follow Up     MS, migraine headaches         This is a telehealth visit (audio + visual components).    Molly [name] is a 62 year old left-handed woman who is well-known to me.  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  I last evaluated her in December 2023.  She was diagnosed with MS many years ago.  Her symptoms have largely involved severe muscle cramping, numbness and tingling, primarily in the lower extremities, but to some degree in the upper extremities.  At times she has experienced weakness in her limbs as well.  Her symptoms are much worse when exposed to extreme heat or cold.  She also has severe associated fatigue, and mild imbalance.     From January 2014 until spring 2018 she was on Tecfidera 240 mg twice daily.  She tolerated this well, but continued to have progression of her disease.  She was started on Tysabri in June 2018 and remained on this until late 2019, but developed severe generalized burning sensation in her body during and after her infusions, increasing with each infusion, so we discontinued it.  We checked her for antibody development to Tysabri, but these were negative, so it was unlikely she was actually experiencing allergic reaction.      [name] experienced a fairly severe exacerbation in November 2020 and was treated with Acthar gel at that time.  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  She saw Dr. Cindi Carbon, gastroenterologist, and was placed on Entecavir.  After discussion with Dr. Darrin Luis, we agreed that it was reasonable for her to go ahead and initiate Ocrevus.  In December 2020 she underwent her first series of Ocrevus.  Other than mild associated flushing with the infusions, she tolerated these well.  She developed a severe rash on her feet, initially thought she had shingles, so decided to stop the infusions.  As it turns out, this was actually a severe flare of her psoriasis.  She has been seeing Dr. Assunta Gambles, dermatologist as well as Dr. Laneta Simmers rheumatologist for psoriasis with associated psoriatic arthritis.  I understand that Dr. Claudean Severance felt she would be better served by a different rheumatologist given her complicated case.  She is interested in seeing [facility].  She has been off methotrexate for the last 4 to 6 months.  Fortunately, her psoriasis is under better control, with only a small patch on her elbow.  She believes her ulcerative colitis has been stable.  She is now off Entecavir, under the approval of Dr. Darrin Luis.     Tizanidine and gabapentin as well as lamotrigine continue to be helpful for her pain and muscle spasm.  Clonazepam is taken as needed to help her sleep as well as for severe muscle spasm unrelieved by above.     Molly [name] also has history of subclavian steal syndrome and continues to be a lifelong smoker, unable to quit in the past.  She remains on clopidogrel 75 mg daily.  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 Harley Alto who did not feel it was worthwhile to revascularize the kidney.     Repeat MRIs of the brain and thoracic spine in September 2022 were completely unchanged from prior  study.     [name] was recently evaluated by rheumatology here at [facility].  I have communicated back-and-forth with them regarding possible treatments for her severe psoriasis and psoriatic arthritis.        Past Medical History:   Diagnosis Date    Arthritis     Chest pain     Dizziness     Generalized headaches     Heart palpitations 03-21-2022    Hepatitis B infection     High cholesterol     HTN (hypertension)     HX: anticoagulation     Hyperlipemia     Hypertriglyceridemia     Migraines Childhood    Multiple sclerosis (HCC)     Neuropathy 2008    Obesity     Other dysphagia March 2023    Colonoscopy found a single ulcer in colon    Postmenopausal     Psoriasis     Tobacco use 07/27/2021    Unspecified deficiency anemia 1968    I was anemic as a child            Social Determinants of Health     Tobacco Use: High Risk (09/27/2022)    Patient History     Smoking Tobacco Use: Every Day     Smokeless Tobacco Use: Never     Passive Exposure: Not on file   Alcohol Use: Not on file (03/17/2021)   Financial Resource Strain: Not on file   Food Insecurity: Not on file   Transportation Needs: Not on file   Stress: Not on file   Social Connections: Not on file   Health Literacy: Not on file   Depression: Not at risk (09/21/2022)    PHQ-2     PHQ-2 Score: 0   Housing Stability: Not on file         Family History   Problem Relation Name Age of Onset    Cancer Mother Okey Regal         Pancreatic cancer alcoholic    Alcohol abuse Mother Greggory Brandy Mother Okey Regal     Glaucoma Mother Okey Regal     Hypertension Father Leighton Parody maker heart attack age 45    Coronary Artery Disease Father Onalee Hua     Diabetes Type II Father Onalee Hua     Heart Attack Father Onalee Hua     Migraines Father Onalee Hua     Seizures Son B-Joe     Alcohol abuse Maternal Grandmother      Hypertension Paternal Grandmother Myrtis Ser     Diabetes Type II Paternal Grandmother Myrtis Ser     Diabetes Paternal Grandmother Myrtis Ser     Hypertension Paternal Addison Bailey         Renal artery disease    Heart Attack Paternal Addison Bailey     Diabetes Type II Paternal Addison Bailey     Coronary Artery Disease Paternal Addison Bailey     Kidney Disease Paternal Grandfather Rayna Sexton     Diabetes Paternal Grandfather Rayna Sexton     Cancer Other Donnamarie Poag         Brain    Cancer Maternal Aunt Sandy     Arthritis-rheumatoid Paternal Aunt      Hypertension Other All of my fathers siblings        Review of Systems      Objective:         Allergies   Allergen Reactions    Atorvastatin MUSCLE  PAIN    Adhesive Tape (Rosins) RASH    Adhesive UNKNOWN    Bupropion Hcl AGITATION and PALPITATIONS    Cefdinir DIARRHEA    Doxycycline NAUSEA AND VOMITING    Ezetimibe DIARRHEA    Tricyclic Antidepressants And Tricyclic Compounds UNKNOWN        amLODIPine (NORVASC) 5 mg tablet Take one tablet by mouth twice daily.    butalbital-acetaminophen-caffeine (ESGIC) 50-325-40 mg tablet Take one tablet by mouth every 4 hours as needed for Headache.    calcipotriene (DOVONEX) 0.005 % crea Apply  topically to affected area twice daily.    clobetasoL (TEMOVATE) 0.05 % topical ointment Apply  topically to affected area twice daily.    clonazePAM (KLONOPIN) 1 mg tablet Take one tablet by mouth three times daily.    clonidine hcl (KAPVAY) 0.1 mg ER tablet Take one tablet by mouth as Needed.    clopiDOGrel (PLAVIX) 75 mg tablet Take one tablet by mouth daily.    diphenhydrAMINE-acetaminophen 25-500 mg tab tablet Take 500 tablets by mouth daily. Taking 1000 mg QHS    folic acid (FOLVITE) 1 mg tablet Take one tablet by mouth daily.    gabapentin (NEURONTIN) 300 mg capsule Take three capsules by mouth three times daily.    levocetirizine (XYZAL) 5 mg tablet Take one tablet by mouth daily.    olmesartan (BENICAR) 20 mg tablet Take 1 tablet by mouth twice daily    tiZANidine (ZANAFLEX) 4 mg tablet Take two tablets by mouth three times daily. (Patient taking differently: Take 1.5 tablets by mouth at bedtime as needed.)    traMADoL (ULTRAM) 50 mg tablet Take one tablet by mouth as Needed for Pain.     Vitals:    09/27/22 0757   PainSc: Eight   Weight: 83.5 kg (184 lb)   Height: 162.6 cm (5' 4)     Body mass index is 31.58 kg/m?Marland Kitchen     Physical Exam    Office Procedure:           Assessment and Plan:    1. Multiple sclerosis (HCC)    2. Other migraine without status migrainosus, intractable    3. Psoriasis        Multiple sclerosis, overall neurologically stable.  She has remained off disease modifying therapy for the last few years which I have felt was fine.  Her MRIs have continued to be stable.    Psoriasis, fairly severe, with new options being considered.  I have been communicating with her rheumatology team regarding what medications might be best for her.    Migraine headaches, overall reasonably well-controlled.  Continue as needed medications as above.    I will see her again for follow-up in 6 months from now (telehealth is fine).    Total time today was 32 minutes in the following activities: preparing to see the patient, reviewing records, performing a medically appropriate examination, counseling and educating the patient, ordering tests, medications, and treatment, documenting critical information in the EHR, referring and communicating with other healthcare professionals (when not separately reported), and coordinating care.

## 2022-09-27 NOTE — Progress Notes
6/4 GI Notes recvd scanning into [facility].   6/4 Faxed office visit notes to Dr. Herschell Dimes @ [phone].   6/4 Requested last 2 ov notes (no procedures available - per [facility]) from Dr. Farris Has office - phone [phone]. cb

## 2022-09-28 ENCOUNTER — Encounter: Admit: 2022-09-28 | Discharge: 2022-09-28 | Payer: MEDICARE

## 2022-09-28 DIAGNOSIS — L4050 Arthropathic psoriasis, unspecified: Secondary | ICD-10-CM

## 2022-09-28 DIAGNOSIS — Z79899 Other long term (current) drug therapy: Secondary | ICD-10-CM

## 2022-09-28 NOTE — Telephone Encounter
-----   Message from [place] A sent at 09/28/2022  1:03 PM CDT -----    ----- Message -----  From: Peggye Form, MD  Sent: 09/28/2022  12:39 PM CDT  To: Despina Hidden, RN    @Marcos , please let patient know I reviewed her labs.    Her blood counts are normal.  Her serum creatinine is elevated.  Her CRP which is a marker for her inflammation is slightly elevated.  The rest came back within acceptable range.  Her left ankle, foot and hand x-rays did not show any evidence of inflammation related damage however some showed some wear-and-tear changes.  Her lower back x-ray (SI joint x-rays) did not show any evidence of inflammation related damage either.  Her symptoms are overall still suspicious for psoriatic arthritis.    Please have the patient follow-up with her PCP Dr. Herschell Dimes regarding her elevated serum creatinine.  Please also make sure patient is not taking any NSAIDs given risk of acute kidney injury or worsening chronic kidney disease.  Additionally she has elevated alkaline phosphatase.    Please check if patient was able to get her TPMT, QuantiFERON gold, hepatitis B antigen, hep B core IgG, right hand, right foot and right ankle x-rays as these were not included in the results.    Rheumatology pharmacy team, to be provided more education about abatacept infusion to the patient.  This was ordered externally as patient preferred a local infusion and as per note documentation order was sent to PCP office and potential infusion center is [facility]    Dr. Carney Living office Ph. [phone].    09/22/22  -CBC with differential normal  -Serum creatinine 1.44 mg per DL, alkaline phosphatase 564, normal total bilirubin and LFTs  -Uric acid 6.2 mg per DL  -ESR 18 mm/h, CRP 3.32 mg per DL (normal reference range 0.1-0.3 mg per DL), normal total protein  -Nonreactive hepatitis C antibody, hep B core IgM  -Left hand x-ray normal  -Normal SI joints on SI joint x-ray  -Left ankle x-ray with minimal osteophytic spurring of the anterior inferior tibia with small plantar calcaneal spur.  -Left foot x-ray small plantar calcaneal spur mild degenerative changes of first MTP joint

## 2022-09-28 NOTE — Progress Notes
@  [name], please let patient know I reviewed her labs.    Her blood counts are normal.  Her serum creatinine is elevated.  Her CRP which is a marker for her inflammation is slightly elevated.  The rest came back within acceptable range.  Her left ankle, foot and hand x-rays did not show any evidence of inflammation related damage however some showed some wear-and-tear changes.  Her lower back x-ray (SI joint x-rays) did not show any evidence of inflammation related damage either.  Her symptoms are overall still suspicious for psoriatic arthritis.    Please have the patient follow-up with her PCP Dr. Herschell Dimes regarding her elevated serum creatinine.  Please also make sure patient is not taking any NSAIDs given risk of acute kidney injury or worsening chronic kidney disease.  Additionally she has elevated alkaline phosphatase.    Please check if patient was able to get her TPMT, QuantiFERON gold, hepatitis B antigen, hep B core IgG, right hand, right foot and right ankle x-rays as these were not included in the results.    Rheumatology pharmacy team, to be provided more education about abatacept infusion to the patient.  This was ordered externally as patient preferred a local infusion and as per note documentation order was sent to PCP office and potential infusion center is [facility]    Dr. Carney Living office Ph. [phone].    09/22/22  -CBC with differential normal  -Serum creatinine 1.44 mg per DL, alkaline phosphatase 086, normal total bilirubin and LFTs  -Uric acid 6.2 mg per DL  -ESR 18 mm/h, CRP 5.78 mg per DL (normal reference range 0.1-0.3 mg per DL), normal total protein  -Nonreactive hepatitis C antibody, hep B core IgM  -Left hand x-ray normal  -Normal SI joints on SI joint x-ray  -Left ankle x-ray with minimal osteophytic spurring of the anterior inferior tibia with small plantar calcaneal spur.  -Left foot x-ray small plantar calcaneal spur mild degenerative changes of first MTP joint

## 2022-09-28 NOTE — Telephone Encounter
Noted 09/22/2022 right hand, right foot and right ankle x-rays completed, awaiting to be scanned.

## 2022-10-06 ENCOUNTER — Encounter: Admit: 2022-10-06 | Discharge: 2022-10-06 | Payer: MEDICARE

## 2022-10-12 ENCOUNTER — Encounter: Admit: 2022-10-12 | Discharge: 2022-10-12 | Payer: MEDICARE

## 2022-10-12 DIAGNOSIS — Z79899 Other long term (current) drug therapy: Secondary | ICD-10-CM

## 2022-10-12 DIAGNOSIS — L4050 Arthropathic psoriasis, unspecified: Secondary | ICD-10-CM

## 2022-10-17 ENCOUNTER — Encounter: Admit: 2022-10-17 | Discharge: 2022-10-17 | Payer: MEDICARE

## 2022-10-17 MED ORDER — OLMESARTAN 20 MG PO TAB
20 mg | ORAL_TABLET | Freq: Two times a day (BID) | ORAL | 0 refills | 90.00000 days | Status: AC
Start: 2022-10-17 — End: ?

## 2022-10-17 MED ORDER — TIZANIDINE 4 MG PO TAB
8 mg | ORAL_TABLET | Freq: Three times a day (TID) | ORAL | 0 refills
Start: 2022-10-17 — End: ?

## 2022-12-01 ENCOUNTER — Encounter: Admit: 2022-12-01 | Discharge: 2022-12-01 | Payer: MEDICARE

## 2022-12-08 ENCOUNTER — Encounter: Admit: 2022-12-08 | Discharge: 2022-12-08 | Payer: MEDICARE

## 2022-12-08 MED ORDER — OLMESARTAN 20 MG PO TAB
20 mg | ORAL_TABLET | Freq: Two times a day (BID) | ORAL | 0 refills | 90.00000 days | Status: AC
Start: 2022-12-08 — End: ?

## 2022-12-12 ENCOUNTER — Encounter: Admit: 2022-12-12 | Discharge: 2022-12-12 | Payer: MEDICARE

## 2022-12-13 ENCOUNTER — Encounter: Admit: 2022-12-13 | Discharge: 2022-12-13 | Payer: MEDICARE

## 2023-01-21 ENCOUNTER — Encounter: Admit: 2023-01-21 | Discharge: 2023-01-21 | Payer: MEDICARE

## 2023-01-21 MED ORDER — OLMESARTAN 20 MG PO TAB
20 mg | ORAL_TABLET | Freq: Two times a day (BID) | ORAL | 0 refills
Start: 2023-01-21 — End: ?

## 2023-01-23 ENCOUNTER — Encounter: Admit: 2023-01-23 | Discharge: 2023-01-23 | Payer: MEDICARE

## 2023-01-23 MED ORDER — GABAPENTIN 300 MG PO CAP
900 mg | ORAL_CAPSULE | Freq: Three times a day (TID) | ORAL | 0 refills
Start: 2023-01-23 — End: ?

## 2023-02-22 ENCOUNTER — Encounter: Admit: 2023-02-22 | Discharge: 2023-02-22 | Payer: MEDICARE

## 2023-04-09 ENCOUNTER — Encounter: Admit: 2023-04-09 | Discharge: 2023-04-09 | Payer: MEDICARE

## 2023-04-10 ENCOUNTER — Encounter: Admit: 2023-04-10 | Discharge: 2023-04-10 | Payer: MEDICARE

## 2023-04-10 DIAGNOSIS — R531 Weakness: Secondary | ICD-10-CM

## 2023-04-10 DIAGNOSIS — H538 Other visual disturbances: Secondary | ICD-10-CM

## 2023-04-10 DIAGNOSIS — R200 Anesthesia of skin: Secondary | ICD-10-CM

## 2023-04-10 DIAGNOSIS — G35 Multiple sclerosis: Secondary | ICD-10-CM

## 2023-04-10 NOTE — Telephone Encounter
Discussed Molly [name]'s symptoms and concerns with Dr. Roger Kill. Dr. Roger Kill recommended the following:  ESR  CRP  CBC w/ diff  CMP  B1  B6  B12 Folate    Notified [name] of the above. She requested the lab orders be faxed to [facility]    Attempted to reach [facility], not able to get through. Phone rang many times on multiple attempts. No VM and no one answered.    Notified [name], she requested I email the orders to her and she will print them and take them to the lab tomorrow.

## 2023-04-10 NOTE — Telephone Encounter
Maggie LVM stating she has severe banding around the tops of her arms, wrists and legs. It's very painful. I am not sure what to do?    Kieren sent this patient message on Sunday, 04/09/2023 @ 1229-  Please call me! I?m having SEVERE banding around my arms, wrists & legs. It feels like there?s tourniquets tied around me. I do not remember if I?ve had this happen before. I really can?t function or think straight. Please help!   Thanks!  Juwana     I?ll be calling as soon as I get up in the morning just in case you don?t see this.    Reached out to [place] via phone call, She states this started Thursday night. The only change is she increased amlodipine to 5mg  twice daily. She states the pharmacist told her this was not the cause. Idalys is not sure what else could have caused this. Jetty states it hurts to open the refrigerator with her hands/ wrist having the banding sensation. She states all locations of the banding sensation is a 10/10 pain level. She is able to sleep. Blurry vision left eye, however this has been happening for about a month. She also is having an ongoing headache since before thanksgiving as well. She recently visited with her PCP, Dr. Herschell Dimes. 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 Maisie states Dr. Herschell Dimes did not recommend any additional testing.     Notified Nasreen, I will discuss the above with Dr. Roger Kill and call back with recommendations.

## 2023-04-21 ENCOUNTER — Encounter: Admit: 2023-04-21 | Discharge: 2023-04-21 | Payer: MEDICARE

## 2023-04-24 ENCOUNTER — Encounter: Admit: 2023-04-24 | Discharge: 2023-04-24 | Payer: MEDICARE

## 2023-04-25 ENCOUNTER — Encounter: Admit: 2023-04-25 | Discharge: 2023-04-25 | Payer: MEDICARE

## 2023-05-19 ENCOUNTER — Encounter: Admit: 2023-05-19 | Discharge: 2023-05-19 | Payer: MEDICARE

## 2023-05-23 ENCOUNTER — Encounter: Admit: 2023-05-23 | Discharge: 2023-05-23 | Payer: MEDICARE

## 2023-05-23 ENCOUNTER — Ambulatory Visit: Admit: 2023-05-23 | Discharge: 2023-05-24 | Payer: MEDICARE

## 2023-05-23 DIAGNOSIS — G43819 Other migraine, intractable, without status migrainosus: Secondary | ICD-10-CM

## 2023-05-23 DIAGNOSIS — G35 Multiple sclerosis: Secondary | ICD-10-CM

## 2023-05-23 NOTE — Progress Notes
Date of Service: 05/23/23    Subjective:       Chief Complaint:   Chief Complaint   Patient presents with    Follow Up     MS         This is a telehealth visit (audio + visual components).     Molly [name] is a 63 year old left-handed woman who is well-known to me.  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  I last evaluated her in December 2023.  She was diagnosed with MS many years ago.  Her symptoms have largely involved severe muscle cramping, numbness and tingling, primarily in the lower extremities, but to some degree in the upper extremities.  At times she has experienced weakness in her limbs as well.  Her symptoms are much worse when exposed to extreme heat or cold.  She also has severe associated fatigue, and mild imbalance.     From January 2014 until spring 2018 she was on Tecfidera 240 mg twice daily.  She tolerated this well, but continued to have progression of her disease.  She was started on Tysabri in June 2018 and remained on this until late 2019, but developed severe generalized burning sensation in her body during and after her infusions, increasing with each infusion, so we discontinued it.  We checked her for antibody development to Tysabri, but these were negative, so it was unlikely she was actually experiencing allergic reaction.      Molly [name] experienced a fairly severe exacerbation in November 2020 and was treated with Acthar gel at that time.  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  She saw Dr. Cindi [name], gastroenterologist, and was placed on Entecavir.  After discussion with Dr. Darrin [name], we agreed that it was reasonable for her to go ahead and initiate Ocrevus.  In December 2020 she underwent her first series of Ocrevus.  Other than mild associated flushing with the infusions, she tolerated these well.  She developed a severe rash on her feet, initially thought she had shingles, so decided to stop the infusions.  As it turns out, this was actually a severe flare of her psoriasis.  She has been seeing Dr. Assunta [name], dermatologist as well as Dr. Laneta [name] rheumatologist for psoriasis with associated psoriatic arthritis.  I understand that Dr. Claudean [name] felt she would be better served by a different rheumatologist given her complicated case.  She is interested in seeing [name].  She has been off methotrexate for the last 4 to 6 months.  Fortunately, her psoriasis is under better control, with only a small patch on her elbow.  She believes her ulcerative colitis has been stable.  She is now off Entecavir, under the approval of Dr. Darrin [name].     Tizanidine and gabapentin as well as lamotrigine continue to be helpful for her pain and muscle spasm.  Clonazepam is taken as needed to help her sleep as well as for severe muscle spasm unrelieved by above.     Molly [name] also has history of subclavian steal syndrome and continues to be a lifelong smoker, unable to quit in the past.  She remains on clopidogrel 75 mg daily.  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     Repeat MRIs of the brain and thoracic spine in September 2022 were completely unchanged from prior study.  In 2024, Molly [name] was evaluated by rheumatology here at [name].  I have communicated back-and-forth with them regarding possible treatments for her severe psoriasis and psoriatic arthritis. Given her worsening pain, naltrexone has been initiated. She is tolerating this well and believes this has been helpful.           Past Medical History:    Arthritis    Chest pain    Dizziness    Generalized headaches    Heart palpitations    Hepatitis B infection    High cholesterol    HTN (hypertension)    HX: anticoagulation    Hyperlipemia    Hypertriglyceridemia    Migraines    Multiple sclerosis (HCC)    Neuropathy    Obesity    Other dysphagia Postmenopausal    Psoriasis    Tobacco use    Unspecified deficiency anemia            Social Drivers of Health     Tobacco Use: High Risk (04/20/2023)    Patient History     Smoking Tobacco Use: Every Day     Smokeless Tobacco Use: Never     Passive Exposure: Not on file   Alcohol Use: Not At Risk (05/19/2023)    Alcohol Use     Alcohol Use: No     Female: 9+ ounces (15+ Standard Drinks) per week Threshold: Not on file     Female: 4.8+ ounces (8+ Standard Drinks) per week Threshold: 0   Financial Resource Strain: Not on file   Food Insecurity: Not on file   Transportation Needs: Not on file   Stress: Not on file   Social Connections: Not on file   Health Literacy: Not on file   Depression: Not at risk (04/17/2023)    PHQ-2     PHQ-2 Score: 1   Housing Stability: Not on file         Family History   Problem Relation Name Age of Onset    Cancer Mother Molly [name]         Pancreatic cancer alcoholic    Alcohol abuse Mother Molly [name] Mother Molly [name]     Glaucoma Mother Molly [name]     Hypertension Father Molly [name] maker heart attack age 57    Coronary Artery Disease Father Molly [name]     Diabetes Type II Father Molly [name]     Heart Attack Father Molly [name]     Migraines Father Molly [name]     Seizures Son Molly [name]     Alcohol abuse Maternal Grandmother      Hypertension Paternal Grandmother Molly [name]     Diabetes Type II Paternal Grandmother Molly [name]     Diabetes Paternal Grandmother Molly [name]     Hypertension Paternal Molly [name]         Renal artery disease    Heart Attack Paternal Molly [name]     Diabetes Type II Paternal Molly [name]     Coronary Artery Disease Paternal Molly [name]     Kidney Disease Paternal Grandfather Molly [name]     Diabetes Paternal Grandfather Molly [name]     Cancer Other Molly [name]         Brain    Cancer Maternal Aunt Molly [name]     Arthritis-rheumatoid Paternal Aunt      Hypertension Other All of my fathers siblings        Review of Systems      Objective:  Allergies   Allergen Reactions    Atorvastatin MUSCLE PAIN    Adhesive Tape (Rosins) RASH    Adhesive UNKNOWN    Bupropion Hcl AGITATION and PALPITATIONS    Cefdinir DIARRHEA    Doxycycline NAUSEA AND VOMITING    Ezetimibe DIARRHEA    Tricyclic Antidepressants And Tricyclic Compounds UNKNOWN        amLODIPine (NORVASC) 5 mg tablet Take one tablet by mouth twice daily.    butalbital-acetaminophen-caffeine (ESGIC) 50-325-40 mg tablet Take one tablet by mouth every 4 hours as needed for Headache.    calcipotriene (DOVONEX) 0.005 % crea Apply  topically to affected area twice daily.    clobetasoL (TEMOVATE) 0.05 % topical ointment Apply  topically to affected area twice daily.    clonazePAM (KLONOPIN) 1 mg tablet Take one tablet by mouth three times daily.    clonidine hcl (KAPVAY) 0.1 mg ER tablet Take one tablet by mouth as Needed.    clopiDOGrel (PLAVIX) 75 mg tablet Take one tablet by mouth daily.    diphenhydrAMINE-acetaminophen 25-500 mg tab tablet Take 500 tablets by mouth daily. Taking 1000 mg QHS    folic acid (FOLVITE) 1 mg tablet Take one tablet by mouth daily.    gabapentin (NEURONTIN) 300 mg capsule TAKE 3 CAPSULES BY MOUTH THREE TIMES DAILY    levocetirizine (XYZAL) 5 mg tablet Take one tablet by mouth daily.    naltrexone 1.5 mg oral capsule (BATCHED COMPOUND) Take 1 capsule by mouth daily x 1 month, THEN increase to 2 capsules daily x 1 month, THEN increase to 3 capsules daily.    olmesartan (BENICAR) 20 mg tablet Take 1 tablet by mouth twice daily    tiZANidine (ZANAFLEX) 4 mg tablet TAKE 1 & 1/2 (ONE & ONE-HALF) TABLETS BY MOUTH THREE TIMES DAILY    traMADoL (ULTRAM) 50 mg tablet Take one tablet by mouth as Needed for Pain.     Vitals:    05/23/23 1443   PainSc: Eight   Weight: 81.6 kg (180 lb)   Height: 162.6 cm (5' 4)     Body mass index is 30.9 kg/m?Marland Kitchen     Physical Exam    Office Procedure:           Assessment and Plan:    1. Other migraine without status migrainosus, intractable    2. Multiple sclerosis (HCC)      M.S., relatively stable.  Continue all medications as above.      Migraine headaches, well-controlled.    I am very pleased that naltrexone has been so helpful for her pain.  She will continue this under the care of Dr. Lovey Newcomer.    I will see her again for f/u in 6 months from now.    Total time today was 32 minutes in the following activities: preparing to see the patient, reviewing records, performing a medically appropriate examination, counseling and educating the patient, ordering tests, medications, and treatment, documenting critical information in the EHR, referring and communicating with other healthcare professionals (when not separately reported), and coordinating care.

## 2023-05-30 ENCOUNTER — Encounter: Admit: 2023-05-30 | Discharge: 2023-05-30 | Payer: MEDICARE

## 2023-06-01 ENCOUNTER — Encounter: Admit: 2023-06-01 | Discharge: 2023-06-01 | Payer: MEDICARE

## 2023-07-04 ENCOUNTER — Encounter: Admit: 2023-07-04 | Discharge: 2023-07-04 | Payer: MEDICARE

## 2023-07-07 ENCOUNTER — Encounter: Admit: 2023-07-07 | Discharge: 2023-07-07 | Payer: MEDICARE

## 2023-11-15 ENCOUNTER — Encounter: Admit: 2023-11-15 | Discharge: 2023-11-15 | Payer: MEDICARE

## 2023-11-21 ENCOUNTER — Encounter: Admit: 2023-11-21 | Discharge: 2023-11-21 | Payer: MEDICARE

## 2023-12-01 ENCOUNTER — Encounter: Admit: 2023-12-01 | Discharge: 2023-12-01 | Payer: MEDICARE

## 2023-12-08 ENCOUNTER — Encounter: Admit: 2023-12-08 | Discharge: 2023-12-08 | Payer: MEDICARE

## 2023-12-08 ENCOUNTER — Ambulatory Visit: Admit: 2023-12-08 | Discharge: 2023-12-09 | Payer: MEDICARE

## 2023-12-08 DIAGNOSIS — G35 Multiple sclerosis: Principal | ICD-10-CM

## 2023-12-08 DIAGNOSIS — G43819 Other migraine, intractable, without status migrainosus: Secondary | ICD-10-CM

## 2023-12-08 NOTE — Telephone Encounter
 To: Fanny Chen   Fax:[phone]    RE: Molly [name]   DOB: 12-09-1960    Medical Records request for continuation of care    Please fax records to [phone] ATTN: [facility] TEAM with Dr. Lue Bring     Requested Records:      Recent OV notes and images reports from ER and Dr. Kyung     Please cloud all imaging to [facility]      Questions- please call [place] RN at [phone]

## 2023-12-08 NOTE — Progress Notes
 Date of Service: 12/08/23    Subjective:       Chief Complaint:   Chief Complaint   Patient presents with    Follow Up     Multiple sclerosis, migraine headaches     This is a telehealth visit. Real-time video and audio communication was utilized.     Molly [name] is a 63 year old left-handed woman who is well-known to me.  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  I last evaluated her via telehealth in January 2025.  She was diagnosed with MS many years ago.  Her symptoms have largely involved severe muscle cramping, numbness and tingling, primarily in the lower extremities, but to some degree in the upper extremities.  At times she has experienced weakness in her limbs as well.  Her symptoms are much worse when exposed to extreme heat or cold.  She also has severe associated fatigue, and mild imbalance.     From January 2014 until spring 2018 she was on Tecfidera 240 mg twice daily.  She tolerated this well, but continued to have progression of her disease.  She was started on Tysabri in June 2018 and remained on this until late 2019, but developed severe generalized burning sensation in her body during and after her infusions, increasing with each infusion, so we discontinued it.  We checked her for antibody development to Tysabri, but these were negative, so it was unlikely she was actually experiencing allergic reaction.      [name] experienced a fairly severe exacerbation in November 2020 and was treated with Acthar gel at that time.  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  She saw Dr. Cordella Coe, gastroenterologist, and was placed on Entecavir.  After discussion with Dr. Coe, we agreed that it was reasonable for her to go ahead and initiate Ocrevus.  In December 2020 she underwent her first series of Ocrevus.  Other than mild associated flushing with the infusions, she tolerated these well.  She developed a severe rash on her feet, initially thought she had shingles, so decided to stop the infusions.  As it turns out, this was actually a severe flare of her psoriasis.  She has been seeing Dr. Darold, dermatologist as well as Dr. Silver Church rheumatologist for psoriasis with associated psoriatic arthritis.  I understand that Dr. Church felt she would be better served by a different rheumatologist given her complicated case.  She is interested in seeing [place] rheumatology.  She has been off methotrexate for the last 4 to 6 months.  Fortunately, her psoriasis is under better control, with only a small patch on her elbow.  She believes her ulcerative colitis has been stable.  She remains off Entecavir, under the approval of Dr. Coe.     Tizanidine  and gabapentin  as well as lamotrigine continue to be helpful for her pain and muscle spasm.  Clonazepam  is taken as needed to help her sleep as well as for severe muscle spasm unrelieved by above.     [name] also has history of subclavian steal syndrome and continues to be a lifelong smoker, unable to quit in the past.  She remains on clopidogrel 75 mg daily.  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 Lue Bring who did not feel it was worthwhile to revascularize the kidney.     Repeat MRIs of the brain and thoracic spine in September 2022 were completely unchanged  from prior study.     In 2024, Enes was evaluated by rheumatology here at [facility].  I have communicated back-and-forth with them regarding possible treatments for her severe psoriasis and psoriatic arthritis. Given her worsening pain, naltrexone was initiated. She is tolerating this well and believes this has been helpful.    Most recently, [name] has been complaining of lower back pain which is radiating into the right groin.  This has been quite severe at times.  She tells me that Dr. Kyung ordered MRI lumbar spine which was generally unrevealing.  I do not have a copy of this report. [name] was concerned that the pain she was experiencing was probably on the basis of her atrophic right kidney.        Past Medical History:    Anxiety disorder    Arthritis    Chest pain    Dizziness    Generalized headaches    Heart palpitations    Hepatitis B infection    High cholesterol    HTN (hypertension)    HX: anticoagulation    Hyperlipemia    Hypertriglyceridemia    Migraines    Multiple sclerosis (CMS-HCC)    Neuropathy    Obesity    Other dysphagia    Postmenopausal    Psoriasis    Tobacco use    Unspecified deficiency anemia            Social Drivers of Health     Tobacco Use: High Risk (12/08/2023)    Patient History     Smoking Tobacco Use: Every Day     Smokeless Tobacco Use: Never     Passive Exposure: Not on file   Alcohol Use: Not At Risk (05/19/2023)    Alcohol Use     Alcohol Use: No     Female: 9+ ounces (15+ Standard Drinks) per week Threshold: Not on file     Female: 4.8+ ounces (8+ Standard Drinks) per week Threshold: 0   Financial Resource Strain: Not on file   Food Insecurity: Not on file   Transportation Needs: Not on file   Stress: Not on file   Social Connections: Not on file   Health Literacy: Not on file   Depression: Not at risk (11/21/2023)    PHQ-2     PHQ-2 Score: 0   Housing Stability: Not on file         Family History   Problem Relation Name Age of Onset    Cancer Mother Niels         Pancreatic cancer alcoholic    Alcohol abuse Mother Niels Heys Mother Niels     Glaucoma Mother Niels     Hypertension Father Alm Half maker heart attack age 52    Coronary Artery Disease Father Alm     Diabetes Type II Father Alm     Heart Attack Father Alm     Migraines Father Alm     Seizures Son B-Joe     Alcohol abuse Maternal Grandmother      Hypertension Paternal Grandmother Micky     Diabetes Type II Paternal Grandmother Micky     Diabetes Paternal Grandmother Micky     Hypertension Paternal Apolinar Shown         Renal artery disease    Heart Attack Paternal Apolinar Shown     Diabetes Type II Paternal Apolinar Shown     Coronary Artery Disease  Paternal Grandfather Elgin     Kidney Disease Paternal Grandfather Elgin     Diabetes Paternal Grandfather Elgin     Cancer Other Rudolph         Brain    Cancer Maternal Aunt Sandy     Arthritis-rheumatoid Paternal Aunt      Hypertension Other All of my fathers siblings        Review of Systems      Objective:         Allergies[1]     amLODIPine  (NORVASC ) 5 mg tablet Take one tablet by mouth twice daily.    butalbital -acetaminophen -caffeine (ESGIC) 50-325-40 mg tablet Take one tablet by mouth every 4 hours as needed for Headache.    calcipotriene (DOVONEX) 0.005 % crea Apply  topically to affected area twice daily.    clobetasoL (TEMOVATE) 0.05 % topical ointment Apply  topically to affected area twice daily.    clonazePAM  (KLONOPIN ) 1 mg tablet Take one tablet by mouth three times daily.    clonidine hcl (KAPVAY) 0.1 mg ER tablet Take one tablet by mouth as Needed.    clopiDOGrel (PLAVIX) 75 mg tablet Take one tablet by mouth daily.    diphenhydrAMINE-acetaminophen  25-500 mg tab tablet Take 500 tablets by mouth daily. Taking 1000 mg QHS    folic acid (FOLVITE) 1 mg tablet Take one tablet by mouth daily.    gabapentin  (NEURONTIN ) 300 mg capsule TAKE 3 CAPSULES BY MOUTH THREE TIMES DAILY    levocetirizine (XYZAL) 5 mg tablet Take one tablet by mouth daily.    olmesartan  (BENICAR ) 20 mg tablet Take 1 tablet by mouth twice daily    tiZANidine  (ZANAFLEX ) 4 mg tablet TAKE 1 & 1/2 (ONE & ONE-HALF) TABLETS BY MOUTH THREE TIMES DAILY    traMADoL (ULTRAM) 50 mg tablet Take one tablet by mouth as Needed for Pain.     There were no vitals filed for this visit.  There is no height or weight on file to calculate BMI.     Physical Exam    Office Procedure:           Assessment and Plan:    1. Multiple sclerosis (CMS-HCC)    2. Other migraine without status migrainosus, intractable        Overall, Camiyah is stable from the standpoint of MS.  Her migraines are experienced only rarely.  More recently the bigger problem has been right lower back pain radiating into the right inguinal region.  I continue to feel that this could be radicular in origin and tend to doubt that this is related to her atrophic right kidney.  She is going to reach out to Dr. Charlanne, however, to be sure that he does not feel that her pain is an indication of something related to the atrophic right kidney.    I will see her again for follow-up in 6 months from now.    Total time today was 36 minutes in the following activities: preparing to see the patient, reviewing records, performing a medically appropriate examination, counseling and educating the patient, ordering tests, medications, and treatment, documenting critical information in the EHR, referring and communicating with other healthcare professionals (when not separately reported), and coordinating care.                    [1]   Allergies  Allergen Reactions    Atorvastatin MUSCLE PAIN    Adhesive Tape (Rosins) RASH    Adhesive UNKNOWN  Bupropion Hcl AGITATION and PALPITATIONS    Cefdinir DIARRHEA    Doxycycline NAUSEA AND VOMITING    Ezetimibe  DIARRHEA    Tricyclic Antidepressants And Tricyclic Compounds UNKNOWN

## 2023-12-11 ENCOUNTER — Encounter: Admit: 2023-12-11 | Discharge: 2023-12-11 | Payer: MEDICARE

## 2023-12-15 ENCOUNTER — Encounter: Admit: 2023-12-15 | Discharge: 2023-12-15 | Payer: MEDICARE

## 2023-12-18 ENCOUNTER — Encounter: Admit: 2023-12-18 | Discharge: 2023-12-18 | Payer: MEDICARE

## 2023-12-18 NOTE — Telephone Encounter
 MCM from patient below with concerns on her right kidney-       Imaging merged into patients chart and read reports attached to this TE-     Hello! I just finished my appointment with Dr. Suzanne & explained about the pain I?ve been in for 2 weeks. At first they thought I might have an infection due to UC, (my pain is on the right side) there was blood work, CT & and MRI done, all indications point to this kidney shrinking even more. Severely atrophied? is what the radiologist says. My PCP has ordered an ultrasound on Monday to see the kidney even better. The pain is astronomically bad! Dr.Hon didn?t think there should be any pain, and truthfully I don?t know. She told me to reach out to you and hear your thoughts as well.

## 2023-12-27 ENCOUNTER — Encounter: Admit: 2023-12-27 | Discharge: 2023-12-27 | Payer: MEDICARE

## 2024-01-02 ENCOUNTER — Encounter: Admit: 2024-01-02 | Discharge: 2024-01-02 | Payer: MEDICARE

## 2024-01-04 ENCOUNTER — Encounter: Admit: 2024-01-04 | Discharge: 2024-01-04 | Payer: MEDICARE

## 2024-01-04 ENCOUNTER — Ambulatory Visit: Admit: 2024-01-04 | Discharge: 2024-01-05 | Payer: MEDICARE

## 2024-01-04 VITALS — BP 161/101 | HR 98 | Ht 64.0 in | Wt 180.0 lb

## 2024-01-04 DIAGNOSIS — G5791 Unspecified mononeuropathy of right lower limb: Secondary | ICD-10-CM

## 2024-01-04 NOTE — Patient Instructions
 General Instructions:  How to reach me: Please send a MyChart message to the [facility] or leave a voicemail for my nurse Lurena Joiner at [phone].  Scheduling: Our scheduling phone number is [phone]. You can also message scheduling through MyChart.   How to get a medication refill: Five business days before refill needed, please use the MyChart Refill request or contact your pharmacy directly to request medication refills.   How to receive your test results: If you have signed up for MyChart, you will receive your test results and messages from me this way. Otherwise, you will get a phone call or letter. If you are expecting results and have not heard from my office within 2 weeks of your testing, please send a MyChart message or call my office.  Support for many chronic illnesses is available through [employer]: [employer] or [phone].  For questions on nights, weekends or holidays, call the operator at [phone], and ask for the doctor on call for [facility].

## 2024-01-05 ENCOUNTER — Encounter: Admit: 2024-01-05 | Discharge: 2024-01-05 | Payer: MEDICARE

## 2024-01-05 DIAGNOSIS — G5781 Other specified mononeuropathies of right lower limb: Secondary | ICD-10-CM

## 2024-01-05 DIAGNOSIS — M792 Neuralgia and neuritis, unspecified: Secondary | ICD-10-CM

## 2024-01-05 DIAGNOSIS — M5416 Radiculopathy, lumbar region: Principal | ICD-10-CM

## 2024-01-08 ENCOUNTER — Encounter: Admit: 2024-01-08 | Discharge: 2024-01-08 | Payer: MEDICARE

## 2024-01-11 ENCOUNTER — Encounter: Admit: 2024-01-11 | Discharge: 2024-01-11 | Payer: MEDICARE

## 2024-01-15 ENCOUNTER — Encounter: Admit: 2024-01-15 | Discharge: 2024-01-15 | Payer: MEDICARE

## 2024-01-30 ENCOUNTER — Encounter: Admit: 2024-01-30 | Discharge: 2024-01-30 | Payer: MEDICARE

## 2024-02-14 ENCOUNTER — Encounter: Admit: 2024-02-14 | Discharge: 2024-02-14 | Payer: MEDICARE

## 2024-02-14 MED ORDER — CLONAZEPAM 1 MG PO TAB
1 mg | ORAL_TABLET | Freq: Three times a day (TID) | ORAL | 3 refills | 30.00000 days | Status: AC
Start: 2024-02-14 — End: ?

## 2024-03-14 ENCOUNTER — Encounter: Admit: 2024-03-14 | Discharge: 2024-03-14 | Payer: MEDICARE

## 2024-03-14 ENCOUNTER — Ambulatory Visit: Admit: 2024-03-14 | Discharge: 2024-03-15 | Payer: MEDICARE

## 2024-03-24 ENCOUNTER — Encounter: Admit: 2024-03-24 | Discharge: 2024-03-24 | Payer: MEDICARE

## 2024-03-25 ENCOUNTER — Encounter: Admit: 2024-03-25 | Discharge: 2024-03-25 | Payer: MEDICARE

## 2024-03-25 MED ORDER — LAMOTRIGINE 25 MG PO TAB
ORAL_TABLET | ORAL | 0 refills | 30.00000 days | Status: AC
Start: 2024-03-25 — End: ?

## 2024-03-25 NOTE — Progress Notes [1]
 25 mg daily x 2 weeks,  25 mg twice daily x 2 weeks,  50 mg twice daily x 2 weeks

## 2024-05-01 ENCOUNTER — Encounter: Admit: 2024-05-01 | Discharge: 2024-05-01 | Payer: MEDICARE

## 2024-05-01 MED ORDER — TIZANIDINE 4 MG PO TAB
6 mg | ORAL_TABLET | Freq: Three times a day (TID) | ORAL | 0 refills | 30.00000 days | Status: AC
Start: 2024-05-01 — End: ?
